# Patient Record
Sex: Female | Born: 1955 | Race: Black or African American | Hispanic: No | Smoking: Never smoker
Health system: Southern US, Community
[De-identification: ages and names within clinical notes are randomized; demographics above are authoritative.]

## PROBLEM LIST (undated history)

## (undated) DIAGNOSIS — I10 Essential (primary) hypertension: Secondary | ICD-10-CM

## (undated) DIAGNOSIS — M1990 Unspecified osteoarthritis, unspecified site: Secondary | ICD-10-CM

## (undated) DIAGNOSIS — E7800 Pure hypercholesterolemia, unspecified: Secondary | ICD-10-CM

## (undated) DIAGNOSIS — K219 Gastro-esophageal reflux disease without esophagitis: Secondary | ICD-10-CM

## (undated) DIAGNOSIS — G629 Polyneuropathy, unspecified: Secondary | ICD-10-CM

## (undated) DIAGNOSIS — R52 Pain, unspecified: Secondary | ICD-10-CM

## (undated) DIAGNOSIS — G4730 Sleep apnea, unspecified: Secondary | ICD-10-CM

## (undated) HISTORY — DX: Sleep apnea, unspecified: G47.30

## (undated) HISTORY — DX: Polyneuropathy, unspecified: G62.9

## (undated) HISTORY — DX: Unspecified osteoarthritis, unspecified site: M19.90

## (undated) HISTORY — PX: TUBAL LIGATION: SHX77

## (undated) HISTORY — PX: ABDOMINAL HYSTERECTOMY: SHX81

## (undated) HISTORY — DX: Gastro-esophageal reflux disease without esophagitis: K21.9

---

## 2000-09-26 ENCOUNTER — Emergency Department (HOSPITAL_COMMUNITY): Admission: EM | Admit: 2000-09-26 | Discharge: 2000-09-27 | Payer: Self-pay | Admitting: Internal Medicine

## 2000-12-15 ENCOUNTER — Emergency Department (HOSPITAL_COMMUNITY): Admission: EM | Admit: 2000-12-15 | Discharge: 2000-12-15 | Payer: Self-pay | Admitting: Emergency Medicine

## 2002-06-13 ENCOUNTER — Encounter: Payer: Self-pay | Admitting: Family Medicine

## 2002-06-13 ENCOUNTER — Ambulatory Visit (HOSPITAL_COMMUNITY): Admission: RE | Admit: 2002-06-13 | Discharge: 2002-06-13 | Payer: Self-pay | Admitting: Family Medicine

## 2002-07-04 ENCOUNTER — Emergency Department (HOSPITAL_COMMUNITY): Admission: EM | Admit: 2002-07-04 | Discharge: 2002-07-04 | Payer: Self-pay | Admitting: Emergency Medicine

## 2003-01-20 ENCOUNTER — Emergency Department (HOSPITAL_COMMUNITY): Admission: EM | Admit: 2003-01-20 | Discharge: 2003-01-20 | Payer: Self-pay | Admitting: *Deleted

## 2003-05-15 ENCOUNTER — Emergency Department (HOSPITAL_COMMUNITY): Admission: EM | Admit: 2003-05-15 | Discharge: 2003-05-16 | Payer: Self-pay | Admitting: *Deleted

## 2003-05-16 ENCOUNTER — Ambulatory Visit (HOSPITAL_COMMUNITY): Admission: RE | Admit: 2003-05-16 | Discharge: 2003-05-16 | Payer: Self-pay | Admitting: *Deleted

## 2003-05-21 ENCOUNTER — Ambulatory Visit (HOSPITAL_COMMUNITY): Admission: RE | Admit: 2003-05-21 | Discharge: 2003-05-21 | Payer: Self-pay | Admitting: Family Medicine

## 2003-11-28 ENCOUNTER — Ambulatory Visit (HOSPITAL_COMMUNITY): Admission: RE | Admit: 2003-11-28 | Discharge: 2003-11-28 | Payer: Self-pay | Admitting: Family Medicine

## 2004-05-22 ENCOUNTER — Emergency Department (HOSPITAL_COMMUNITY): Admission: EM | Admit: 2004-05-22 | Discharge: 2004-05-22 | Payer: Self-pay | Admitting: Emergency Medicine

## 2005-01-14 ENCOUNTER — Ambulatory Visit (HOSPITAL_COMMUNITY): Admission: RE | Admit: 2005-01-14 | Discharge: 2005-01-14 | Payer: Self-pay | Admitting: Family Medicine

## 2005-11-13 ENCOUNTER — Emergency Department (HOSPITAL_COMMUNITY): Admission: EM | Admit: 2005-11-13 | Discharge: 2005-11-13 | Payer: Self-pay | Admitting: Emergency Medicine

## 2006-02-07 ENCOUNTER — Ambulatory Visit (HOSPITAL_COMMUNITY): Admission: RE | Admit: 2006-02-07 | Discharge: 2006-02-07 | Payer: Self-pay | Admitting: Family Medicine

## 2006-04-15 ENCOUNTER — Emergency Department (HOSPITAL_COMMUNITY): Admission: EM | Admit: 2006-04-15 | Discharge: 2006-04-15 | Payer: Self-pay | Admitting: Emergency Medicine

## 2006-05-04 ENCOUNTER — Emergency Department (HOSPITAL_COMMUNITY): Admission: EM | Admit: 2006-05-04 | Discharge: 2006-05-04 | Payer: Self-pay | Admitting: Emergency Medicine

## 2007-06-06 ENCOUNTER — Ambulatory Visit (HOSPITAL_COMMUNITY): Admission: RE | Admit: 2007-06-06 | Discharge: 2007-06-06 | Payer: Self-pay | Admitting: Family Medicine

## 2008-06-16 ENCOUNTER — Ambulatory Visit (HOSPITAL_COMMUNITY): Admission: RE | Admit: 2008-06-16 | Discharge: 2008-06-16 | Payer: Self-pay | Admitting: Obstetrics and Gynecology

## 2008-11-25 ENCOUNTER — Encounter (INDEPENDENT_AMBULATORY_CARE_PROVIDER_SITE_OTHER): Payer: Self-pay | Admitting: *Deleted

## 2009-07-21 ENCOUNTER — Ambulatory Visit (HOSPITAL_COMMUNITY): Admission: RE | Admit: 2009-07-21 | Discharge: 2009-07-21 | Payer: Self-pay | Admitting: Family Medicine

## 2009-07-31 ENCOUNTER — Encounter: Payer: Self-pay | Admitting: Gastroenterology

## 2009-08-04 ENCOUNTER — Telehealth (INDEPENDENT_AMBULATORY_CARE_PROVIDER_SITE_OTHER): Payer: Self-pay

## 2009-08-11 ENCOUNTER — Ambulatory Visit: Payer: Self-pay | Admitting: Gastroenterology

## 2009-08-11 ENCOUNTER — Ambulatory Visit (HOSPITAL_COMMUNITY): Admission: RE | Admit: 2009-08-11 | Discharge: 2009-08-11 | Payer: Self-pay | Admitting: Gastroenterology

## 2009-10-17 ENCOUNTER — Emergency Department (HOSPITAL_COMMUNITY): Admission: EM | Admit: 2009-10-17 | Discharge: 2009-10-17 | Payer: Self-pay | Admitting: Emergency Medicine

## 2010-01-31 ENCOUNTER — Encounter: Payer: Self-pay | Admitting: Family Medicine

## 2010-02-11 NOTE — Progress Notes (Signed)
Summary: phone note/pt rescheduled lmissed TCS  Phone Note Call from Patient   Caller: Patient Summary of Call: Pt called, could not make appt yesterday, ride issues. Has rescheduled to 08/11/2009 @ 8:15. Kim aware. Initial call taken by: Cloria Spring LPN,  August 04, 2009 4:20 PM

## 2010-02-11 NOTE — Letter (Signed)
Summary: TCS TRIAGE  TCS TRIAGE   Imported By: Diana Eves 07/31/2009 16:55:06  _____________________________________________________________________  External Attachment:    Type:   Image     Comment:   External Document

## 2010-03-24 LAB — RAPID STREP SCREEN (MED CTR MEBANE ONLY): Streptococcus, Group A Screen (Direct): NEGATIVE

## 2010-07-01 ENCOUNTER — Other Ambulatory Visit (HOSPITAL_COMMUNITY): Payer: Self-pay | Admitting: Family Medicine

## 2010-07-01 DIAGNOSIS — Z139 Encounter for screening, unspecified: Secondary | ICD-10-CM

## 2010-07-26 ENCOUNTER — Ambulatory Visit (HOSPITAL_COMMUNITY): Payer: Medicare Other

## 2010-07-27 ENCOUNTER — Ambulatory Visit (HOSPITAL_COMMUNITY): Payer: Medicare Other

## 2010-08-02 ENCOUNTER — Ambulatory Visit (HOSPITAL_COMMUNITY): Payer: Medicare Other

## 2010-08-06 ENCOUNTER — Ambulatory Visit (HOSPITAL_COMMUNITY)
Admission: RE | Admit: 2010-08-06 | Discharge: 2010-08-06 | Disposition: A | Discharge status: Home / Self Care | Payer: Medicare Other | Source: Ambulatory Visit | Attending: Family Medicine | Admitting: Family Medicine

## 2010-08-06 DIAGNOSIS — Z1231 Encounter for screening mammogram for malignant neoplasm of breast: Secondary | ICD-10-CM | POA: Insufficient documentation

## 2010-08-06 DIAGNOSIS — Z139 Encounter for screening, unspecified: Secondary | ICD-10-CM

## 2010-08-09 ENCOUNTER — Ambulatory Visit (HOSPITAL_COMMUNITY): Payer: Medicare Other

## 2011-08-22 ENCOUNTER — Other Ambulatory Visit (HOSPITAL_COMMUNITY): Payer: Self-pay | Admitting: Family Medicine

## 2011-08-22 DIAGNOSIS — Z139 Encounter for screening, unspecified: Secondary | ICD-10-CM

## 2011-08-23 ENCOUNTER — Ambulatory Visit (HOSPITAL_COMMUNITY)
Admission: RE | Admit: 2011-08-23 | Discharge: 2011-08-23 | Disposition: A | Discharge status: Home / Self Care | Payer: Medicare Other | Source: Ambulatory Visit | Attending: Family Medicine | Admitting: Family Medicine

## 2011-08-23 DIAGNOSIS — Z139 Encounter for screening, unspecified: Secondary | ICD-10-CM

## 2011-08-23 DIAGNOSIS — Z1231 Encounter for screening mammogram for malignant neoplasm of breast: Secondary | ICD-10-CM | POA: Insufficient documentation

## 2011-09-13 ENCOUNTER — Ambulatory Visit: Payer: Medicare Other | Attending: Neurology | Admitting: Sleep Medicine

## 2011-09-13 DIAGNOSIS — G4700 Insomnia, unspecified: Secondary | ICD-10-CM

## 2011-09-13 DIAGNOSIS — G4733 Obstructive sleep apnea (adult) (pediatric): Secondary | ICD-10-CM | POA: Insufficient documentation

## 2011-09-13 DIAGNOSIS — Z6841 Body Mass Index (BMI) 40.0 and over, adult: Secondary | ICD-10-CM | POA: Insufficient documentation

## 2011-09-22 NOTE — Procedures (Signed)
[facility] [name] A. Gerilyn Pilgrim, MD     [email or web address]         NAME:  Breanna [name], Breanna [name]                   ACCOUNT NO.:  [id number]  MEDICAL RECORD NO.:  [id number]          PATIENT TYPE:  OUT  LOCATION:  SLEEP LAB                     FACILITY:  [facility]  PHYSICIAN:  Kaisley Stiverson A. Gerilyn Pilgrim, M.D. DATE OF BIRTH:  1955/10/02  DATE OF STUDY:  09/13/2011                           NOCTURNAL POLYSOMNOGRAM  REFERRING PHYSICIAN:  Mila Homer. Sudie Bailey, M.D.  REFERRING PHYSICIAN:  Saloni Lablanc A. Gerilyn Pilgrim, M.D.  INDICATION:  This is a 56 year old lady who presents with obesity, fatigue, and snoring.  The study is being done to evaluate for obstructive sleep apnea syndrome.  MEDICATIONS:  Alprazolam, Ambien.  EPWORTH SLEEPINESS SCALE: 1.   BMI 54.  ARCHITECTURAL SUMMARY:  The total recording time is 366 minutes.  Sleep efficiency 57%.  Sleep latency 30 minutes.  REM latency 0 minutes. Stage N1 12.5%, N2 87.5%, N3 0%, and REM sleep 0%.  RESPIRATORY SUMMARY:  Baseline oxygen saturation is 99%, lowest saturation 81%.  Diagnostic AHI 27 and RDI 28.  LIMB MOVEMENT SUMMARY:  PLM index 0.  ELECTROCARDIOGRAM SUMMARY:  Average heart rate is 71 with no significant dysrhythmias observed.  IMPRESSION: 1. Moderate obstructive sleep apnea syndrome. Suggest formal titration.  2. Abnormal sleep architecture with absent slow-wave sleep and REM     sleep.    Taleisha Kaczynski A. Gerilyn Pilgrim, M.D.    KAD/MEDQ  D:  09/22/2011 09:41:11  T:  09/22/2011 09:57:24  Job:  [id number]

## 2012-05-23 ENCOUNTER — Other Ambulatory Visit: Payer: Self-pay | Admitting: Neurology

## 2012-05-23 DIAGNOSIS — G4730 Sleep apnea, unspecified: Secondary | ICD-10-CM

## 2012-05-31 ENCOUNTER — Ambulatory Visit: Payer: Medicare Other | Attending: Neurology | Admitting: Sleep Medicine

## 2012-05-31 DIAGNOSIS — G4733 Obstructive sleep apnea (adult) (pediatric): Secondary | ICD-10-CM | POA: Insufficient documentation

## 2012-05-31 DIAGNOSIS — G4730 Sleep apnea, unspecified: Secondary | ICD-10-CM

## 2012-05-31 DIAGNOSIS — G4700 Insomnia, unspecified: Secondary | ICD-10-CM

## 2012-06-07 NOTE — Procedures (Signed)
[facility] Payslee Bateson A. Gerilyn Pilgrim, MD     [email or web address]        NAME:  Breanna [name], Breanna [name]                   ACCOUNT NO.:  [id number]  MEDICAL RECORD NO.:  [id number]          PATIENT TYPE:  OUT  LOCATION:  SLEEP LAB                     FACILITY:  [facility]  PHYSICIAN:  Kelsy Polack A. Gerilyn Pilgrim, M.D. DATE OF BIRTH:  02-01-1955  DATE OF STUDY:  05/31/2012                           NOCTURNAL POLYSOMNOGRAM  REFERRING PHYSICIAN:  Belisa Eichholz A. Gerilyn Pilgrim, M.D.  INDICATION:  This is a 57 year old lady who has a history of obstructive sleep apnea syndrome diagnosed by polysomnography.  This is a CPAP titration recording.  MEDICATIONS:  Alprazolam, Flonase, Ambien, Cymbalta, Phenergan, lovastatin, potassium.  EPWORTH SLEEPINESS SCORE:  1. BMI 51.  ARCHITECTURAL SUMMARY:  The total recording time is 370 minutes.  Sleep efficiency 35%.  Sleep latency 43 minutes.  REM latency 0, stage N1 is 23%, N2 is 76% N3 is 0.4%, and REM sleep 0%.  RESPIRATORY SUMMARY:  Baseline oxygen saturation 93, lowest saturation 85.  The patient was placed on positive pressure is 5 and titrated to 10.  Optimal pressure is 10 with resolution of obstructive events.  The patient was noted to have episodes of periodic breathing with sleep onset precipitating central apneas.  These occurred throughout the recording even on the patient optimal pressure.  LIMB MOVEMENT SUMMARY:  PLM index 0.  ELECTROCARDIOGRAM SUMMARY:  Average heart rate is 70 with no significant dysrhythmias observed.  IMPRESSION: 1. Obstructive sleep apnea syndrome, which responds well to a CPAP of     9. 2. Periodic breathing.  RECOMMENDATION:  CPAP of 9 and use of hypnotic sedative medication to treat the patient's periodic breathing.     Braxtyn Bojarski A. Gerilyn Pilgrim, M.D.    KAD/MEDQ  D:  06/07/2012 09:48:34  T:  06/07/2012 10:01:55  Job:  [id number]

## 2012-06-25 DIAGNOSIS — R109 Unspecified abdominal pain: Secondary | ICD-10-CM

## 2012-08-16 ENCOUNTER — Other Ambulatory Visit (HOSPITAL_COMMUNITY): Payer: Self-pay | Admitting: Family Medicine

## 2012-08-16 DIAGNOSIS — Z09 Encounter for follow-up examination after completed treatment for conditions other than malignant neoplasm: Secondary | ICD-10-CM

## 2012-08-24 ENCOUNTER — Ambulatory Visit (HOSPITAL_COMMUNITY)
Admission: RE | Admit: 2012-08-24 | Discharge: 2012-08-24 | Disposition: A | Discharge status: Home / Self Care | Payer: Medicare Other | Source: Ambulatory Visit | Attending: Family Medicine | Admitting: Family Medicine

## 2012-08-24 DIAGNOSIS — Z09 Encounter for follow-up examination after completed treatment for conditions other than malignant neoplasm: Secondary | ICD-10-CM

## 2012-08-24 DIAGNOSIS — Z1231 Encounter for screening mammogram for malignant neoplasm of breast: Secondary | ICD-10-CM | POA: Insufficient documentation

## 2013-06-27 ENCOUNTER — Emergency Department (HOSPITAL_COMMUNITY)
Admission: EM | Admit: 2013-06-27 | Discharge: 2013-06-27 | Disposition: A | Discharge status: Home / Self Care | Payer: Medicare Other | Attending: Emergency Medicine | Admitting: Emergency Medicine

## 2013-06-27 ENCOUNTER — Encounter (HOSPITAL_COMMUNITY): Payer: Self-pay | Admitting: Emergency Medicine

## 2013-06-27 DIAGNOSIS — Z862 Personal history of diseases of the blood and blood-forming organs and certain disorders involving the immune mechanism: Secondary | ICD-10-CM | POA: Diagnosis not present

## 2013-06-27 DIAGNOSIS — Z8639 Personal history of other endocrine, nutritional and metabolic disease: Secondary | ICD-10-CM | POA: Insufficient documentation

## 2013-06-27 DIAGNOSIS — Z760 Encounter for issue of repeat prescription: Secondary | ICD-10-CM | POA: Insufficient documentation

## 2013-06-27 DIAGNOSIS — I10 Essential (primary) hypertension: Secondary | ICD-10-CM | POA: Diagnosis not present

## 2013-06-27 HISTORY — DX: Pain, unspecified: R52

## 2013-06-27 HISTORY — DX: Pure hypercholesterolemia, unspecified: E78.00

## 2013-06-27 HISTORY — DX: Essential (primary) hypertension: I10

## 2013-06-27 MED ORDER — PROMETHAZINE HCL 25 MG PO TABS: 25.0000 mg | ORAL_TABLET | Freq: Four times a day (QID) | ORAL | Status: AC | PRN

## 2013-06-27 NOTE — ED Provider Notes (Signed)
  Medical screening examination/treatment/procedure(s) were performed by non-physician practitioner and as supervising physician I was immediately available for consultation/collaboration.   EKG Interpretation None            Robert Lockwood, MD 06/27/13 1404 

## 2013-06-27 NOTE — Discharge Instructions (Signed)
Medication Refill, Emergency Department °We have refilled your medication today as a courtesy to you. It is best for your medical care, however, to take care of getting refills done through your primary caregiver's office. They have your records and can do a better job of follow-up than we can in the emergency department. °On maintenance medications, we often only prescribe enough medications to get you by until you are able to see your regular caregiver. This is a more expensive way to refill medications. °In the future, please plan for refills so that you will not have to use the emergency department for this. °Thank you for your help. Your help allows us to better take care of the daily emergencies that enter our department. °Document Released: 04/15/2003 Document Revised: 03/21/2011 Document Reviewed: 12/27/2004 °ExitCare® Patient Information ©2015 [employer]. This information is not intended to replace advice given to you by your health care provider. Make sure you discuss any questions you have with your health care provider. ° °

## 2013-06-27 NOTE — ED Notes (Signed)
Pt reports is out of her phenergan.  Reports Dr. Sudie BaileyKnowlton prescribes it to take before taking her pain medication.  Pt says she did not have any refills on her prescription and his office is closed today.

## 2013-06-27 NOTE — ED Provider Notes (Signed)
CSN: [id number]     Arrival date & time 06/27/13  1314 History   First MD Initiated Contact with Patient 06/27/13 1334     Chief Complaint  Patient presents with  . Medication Refill     (Consider location/radiation/quality/duration/timing/severity/associated sxs/prior Treatment) The history is provided by the patient.   Breanna [name] is a 58 y.o. female who presents to the ED for medication refill. She needs her phenergan to take before taking her pain medication. She ran out and Dr. Sudie BaileyKnowlton is out of the office today. She request Phenergan until she can see him in the office. Patient denies any problems today she just doesn't want to get nausea and vomiting when she takes her other medications.  Past Medical History  Diagnosis Date  . Hypertension   . Hypercholesterolemia   . Pain    Past Surgical History  Procedure Laterality Date  . Abdominal hysterectomy    . Tubal ligation     No family history on file. History  Substance Use Topics  . Smoking status: Never Smoker   . Smokeless tobacco: Not on file  . Alcohol Use: No       OB History   Grav Para Term Preterm Abortions TAB SAB Ect Mult Living                 Review of Systems Negative except as stated in HPI   Allergies  Review of patient's allergies indicates no known allergies.  Home Medications   Prior to Admission medications   Medication Sig Start Date End Date Taking? Authorizing Provider  promethazine (PHENERGAN) 25 MG tablet Take 1 tablet (25 mg total) by mouth every 6 (six) hours as needed for nausea or vomiting. 06/27/13   Hope Orlene OchM Neese, NP   BP 114/73  Pulse 87  Temp(Src) 98.1 F (36.7 C) (Oral)  Resp 18  Ht 5\' 2"  (1.575 m)  Wt 272 lb (123.378 kg)  BMI 49.74 kg/m2  SpO2 94% Physical Exam  Nursing note and vitals reviewed. Constitutional: She is oriented to person, place, and time. She appears well-developed and well-nourished. No distress.  HENT:  Head: Normocephalic.  Eyes: EOM are  normal.  Neck: Neck supple.  Cardiovascular: Normal rate.   Pulmonary/Chest: Effort normal.  Musculoskeletal:  Walks with a cane.   Neurological: She is alert and oriented to person, place, and time. No cranial nerve deficit.  Skin: Skin is warm and dry.  Psychiatric: She has a normal mood and affect. Her behavior is normal.    ED Course  Procedures  MDM  58 y.o. female here for medication refill. Discussed need for planning for refills in advance. Stable for discharge without any complaints today.    Medication List         promethazine 25 MG tablet  Commonly known as:  PHENERGAN  Take 1 tablet (25 mg total) by mouth every 6 (six) hours as needed for nausea or vomiting.           [facility] Orlene OchM Neese, [place] 06/27/13 1341

## 2013-09-11 ENCOUNTER — Other Ambulatory Visit (HOSPITAL_COMMUNITY): Payer: Self-pay | Admitting: Family Medicine

## 2013-09-11 DIAGNOSIS — Z1231 Encounter for screening mammogram for malignant neoplasm of breast: Secondary | ICD-10-CM

## 2013-09-20 ENCOUNTER — Ambulatory Visit (HOSPITAL_COMMUNITY)
Admission: RE | Admit: 2013-09-20 | Discharge: 2013-09-20 | Disposition: A | Discharge status: Home / Self Care | Payer: Medicare Other | Source: Ambulatory Visit | Attending: Family Medicine | Admitting: Family Medicine

## 2013-09-20 DIAGNOSIS — Z1231 Encounter for screening mammogram for malignant neoplasm of breast: Secondary | ICD-10-CM

## 2014-11-05 ENCOUNTER — Other Ambulatory Visit (HOSPITAL_COMMUNITY): Payer: Self-pay | Admitting: Family Medicine

## 2014-11-05 DIAGNOSIS — Z1231 Encounter for screening mammogram for malignant neoplasm of breast: Secondary | ICD-10-CM

## 2014-11-20 ENCOUNTER — Ambulatory Visit (HOSPITAL_COMMUNITY)
Admission: RE | Admit: 2014-11-20 | Discharge: 2014-11-20 | Disposition: A | Discharge status: Home / Self Care | Payer: Medicare Other | Source: Ambulatory Visit | Attending: Family Medicine | Admitting: Family Medicine

## 2014-11-20 DIAGNOSIS — Z1231 Encounter for screening mammogram for malignant neoplasm of breast: Secondary | ICD-10-CM | POA: Insufficient documentation

## 2015-09-28 ENCOUNTER — Other Ambulatory Visit (HOSPITAL_COMMUNITY): Payer: Self-pay | Admitting: Family Medicine

## 2015-09-28 ENCOUNTER — Ambulatory Visit (HOSPITAL_COMMUNITY)
Admission: RE | Admit: 2015-09-28 | Discharge: 2015-09-28 | Disposition: A | Discharge status: Home / Self Care | Payer: Medicare Other | Source: Ambulatory Visit | Attending: Family Medicine | Admitting: Family Medicine

## 2015-09-28 DIAGNOSIS — M19012 Primary osteoarthritis, left shoulder: Secondary | ICD-10-CM | POA: Insufficient documentation

## 2015-09-28 DIAGNOSIS — M25512 Pain in left shoulder: Secondary | ICD-10-CM | POA: Diagnosis present

## 2015-09-28 DIAGNOSIS — R52 Pain, unspecified: Secondary | ICD-10-CM

## 2015-10-16 ENCOUNTER — Other Ambulatory Visit (HOSPITAL_COMMUNITY): Payer: Self-pay | Admitting: Family Medicine

## 2015-10-16 DIAGNOSIS — Z1231 Encounter for screening mammogram for malignant neoplasm of breast: Secondary | ICD-10-CM

## 2015-11-23 ENCOUNTER — Ambulatory Visit (HOSPITAL_COMMUNITY)
Admission: RE | Admit: 2015-11-23 | Discharge: 2015-11-23 | Disposition: A | Discharge status: Home / Self Care | Payer: Medicare Other | Source: Ambulatory Visit | Attending: Family Medicine | Admitting: Family Medicine

## 2015-11-23 DIAGNOSIS — Z1231 Encounter for screening mammogram for malignant neoplasm of breast: Secondary | ICD-10-CM | POA: Diagnosis not present

## 2016-10-25 ENCOUNTER — Other Ambulatory Visit (HOSPITAL_COMMUNITY): Payer: Self-pay | Admitting: Family Medicine

## 2016-10-25 DIAGNOSIS — Z1231 Encounter for screening mammogram for malignant neoplasm of breast: Secondary | ICD-10-CM

## 2016-11-23 ENCOUNTER — Ambulatory Visit (HOSPITAL_COMMUNITY): Payer: Medicare Other

## 2016-11-28 ENCOUNTER — Ambulatory Visit (HOSPITAL_COMMUNITY)
Admission: RE | Admit: 2016-11-28 | Discharge: 2016-11-28 | Disposition: A | Discharge status: Home / Self Care | Payer: Medicare Other | Source: Ambulatory Visit | Attending: Family Medicine | Admitting: Family Medicine

## 2016-11-28 ENCOUNTER — Encounter (HOSPITAL_COMMUNITY): Payer: Self-pay

## 2016-11-28 DIAGNOSIS — Z1231 Encounter for screening mammogram for malignant neoplasm of breast: Secondary | ICD-10-CM | POA: Insufficient documentation

## 2017-05-25 ENCOUNTER — Ambulatory Visit (HOSPITAL_COMMUNITY)
Admission: RE | Admit: 2017-05-25 | Discharge: 2017-05-25 | Disposition: A | Discharge status: Home / Self Care | Payer: Medicare Other | Source: Ambulatory Visit | Attending: Nurse Practitioner | Admitting: Nurse Practitioner

## 2017-05-25 ENCOUNTER — Other Ambulatory Visit (HOSPITAL_COMMUNITY): Payer: Self-pay | Admitting: Nurse Practitioner

## 2017-05-25 DIAGNOSIS — M25561 Pain in right knee: Secondary | ICD-10-CM

## 2017-05-25 DIAGNOSIS — M8588 Other specified disorders of bone density and structure, other site: Secondary | ICD-10-CM | POA: Diagnosis not present

## 2017-05-25 DIAGNOSIS — M25562 Pain in left knee: Secondary | ICD-10-CM | POA: Insufficient documentation

## 2017-06-20 ENCOUNTER — Other Ambulatory Visit (HOSPITAL_COMMUNITY): Payer: Self-pay | Admitting: Family Medicine

## 2017-06-20 DIAGNOSIS — M545 Low back pain: Secondary | ICD-10-CM

## 2017-06-23 ENCOUNTER — Ambulatory Visit (HOSPITAL_COMMUNITY): Payer: Medicare Other

## 2017-06-23 ENCOUNTER — Ambulatory Visit (HOSPITAL_COMMUNITY)
Admission: RE | Admit: 2017-06-23 | Discharge: 2017-06-23 | Disposition: A | Discharge status: Home / Self Care | Payer: Medicare Other | Source: Ambulatory Visit | Attending: Family Medicine | Admitting: Family Medicine

## 2017-06-23 DIAGNOSIS — M545 Low back pain: Secondary | ICD-10-CM

## 2017-12-20 ENCOUNTER — Other Ambulatory Visit (HOSPITAL_COMMUNITY): Payer: Self-pay | Admitting: Family Medicine

## 2017-12-20 DIAGNOSIS — Z1231 Encounter for screening mammogram for malignant neoplasm of breast: Secondary | ICD-10-CM

## 2017-12-21 ENCOUNTER — Inpatient Hospital Stay (HOSPITAL_COMMUNITY): Admission: RE | Admit: 2017-12-21 | Payer: Medicare Other | Source: Ambulatory Visit

## 2018-01-11 ENCOUNTER — Ambulatory Visit (HOSPITAL_COMMUNITY): Payer: Medicare Other

## 2018-02-01 ENCOUNTER — Ambulatory Visit (HOSPITAL_COMMUNITY)
Admission: RE | Admit: 2018-02-01 | Discharge: 2018-02-01 | Disposition: A | Discharge status: Home / Self Care | Payer: Medicare Other | Source: Ambulatory Visit | Attending: Family Medicine | Admitting: Family Medicine

## 2018-02-01 DIAGNOSIS — Z1231 Encounter for screening mammogram for malignant neoplasm of breast: Secondary | ICD-10-CM | POA: Insufficient documentation

## 2018-08-10 ENCOUNTER — Other Ambulatory Visit: Payer: Self-pay

## 2018-08-10 ENCOUNTER — Other Ambulatory Visit: Payer: Medicare Other

## 2018-08-10 DIAGNOSIS — Z20822 Contact with and (suspected) exposure to covid-19: Secondary | ICD-10-CM

## 2018-08-12 LAB — NOVEL CORONAVIRUS, NAA: SARS-CoV-2, NAA: NOT DETECTED

## 2019-02-10 ENCOUNTER — Ambulatory Visit: Payer: Medicare Other

## 2019-02-15 ENCOUNTER — Ambulatory Visit: Payer: Medicare Other

## 2019-02-21 ENCOUNTER — Ambulatory Visit: Payer: Medicare Other

## 2019-03-11 ENCOUNTER — Other Ambulatory Visit (HOSPITAL_COMMUNITY): Payer: Self-pay | Admitting: Family Medicine

## 2019-03-11 DIAGNOSIS — Z1231 Encounter for screening mammogram for malignant neoplasm of breast: Secondary | ICD-10-CM

## 2019-03-13 ENCOUNTER — Ambulatory Visit (HOSPITAL_COMMUNITY)
Admission: RE | Admit: 2019-03-13 | Discharge: 2019-03-13 | Disposition: A | Discharge status: Home / Self Care | Payer: Medicare Other | Source: Ambulatory Visit | Attending: Family Medicine | Admitting: Family Medicine

## 2019-03-13 ENCOUNTER — Other Ambulatory Visit: Payer: Self-pay

## 2019-03-13 DIAGNOSIS — Z1231 Encounter for screening mammogram for malignant neoplasm of breast: Secondary | ICD-10-CM | POA: Diagnosis present

## 2019-03-14 ENCOUNTER — Ambulatory Visit (HOSPITAL_COMMUNITY): Payer: Medicare Other

## 2019-03-15 ENCOUNTER — Other Ambulatory Visit (HOSPITAL_COMMUNITY): Payer: Self-pay | Admitting: Family Medicine

## 2019-03-15 DIAGNOSIS — R928 Other abnormal and inconclusive findings on diagnostic imaging of breast: Secondary | ICD-10-CM

## 2019-03-19 ENCOUNTER — Ambulatory Visit (HOSPITAL_COMMUNITY)
Admission: RE | Admit: 2019-03-19 | Discharge: 2019-03-19 | Disposition: A | Discharge status: Home / Self Care | Payer: Medicare Other | Source: Ambulatory Visit | Attending: Family Medicine | Admitting: Family Medicine

## 2019-03-19 ENCOUNTER — Other Ambulatory Visit: Payer: Self-pay

## 2019-03-19 ENCOUNTER — Other Ambulatory Visit (HOSPITAL_COMMUNITY): Payer: Self-pay | Admitting: Family Medicine

## 2019-03-19 DIAGNOSIS — R928 Other abnormal and inconclusive findings on diagnostic imaging of breast: Secondary | ICD-10-CM

## 2019-03-26 ENCOUNTER — Other Ambulatory Visit (HOSPITAL_COMMUNITY): Payer: Self-pay | Admitting: Family Medicine

## 2019-03-26 ENCOUNTER — Ambulatory Visit (HOSPITAL_COMMUNITY)
Admission: RE | Admit: 2019-03-26 | Discharge: 2019-03-26 | Disposition: A | Discharge status: Home / Self Care | Payer: Medicare Other | Source: Ambulatory Visit | Attending: Family Medicine | Admitting: Family Medicine

## 2019-03-26 ENCOUNTER — Other Ambulatory Visit: Payer: Self-pay

## 2019-03-26 DIAGNOSIS — R928 Other abnormal and inconclusive findings on diagnostic imaging of breast: Secondary | ICD-10-CM

## 2019-03-26 MED ORDER — LIDOCAINE-EPINEPHRINE (PF) 1 %-1:200000 IJ SOLN
INTRAMUSCULAR | Status: AC
  Filled 2019-03-26: qty 30

## 2019-03-26 MED ORDER — SODIUM BICARBONATE 4.2 % IV SOLN
INTRAVENOUS | Status: AC
  Filled 2019-03-26: qty 10

## 2019-03-26 MED ORDER — LIDOCAINE HCL (PF) 2 % IJ SOLN
INTRAMUSCULAR | Status: AC
  Filled 2019-03-26: qty 10

## 2019-03-28 LAB — SURGICAL PATHOLOGY

## 2019-06-17 ENCOUNTER — Encounter: Payer: Self-pay | Admitting: Gastroenterology

## 2020-04-14 ENCOUNTER — Encounter: Payer: Medicare Other | Attending: Family Medicine | Admitting: Nutrition

## 2020-04-14 ENCOUNTER — Other Ambulatory Visit: Payer: Self-pay

## 2020-04-14 VITALS — Ht 62.0 in | Wt 314.0 lb

## 2020-04-14 DIAGNOSIS — Z6841 Body Mass Index (BMI) 40.0 and over, adult: Secondary | ICD-10-CM | POA: Insufficient documentation

## 2020-04-14 NOTE — Progress Notes (Signed)
Medical Nutrition Therapy  Appointment Start time:  1530  Appointment End time:   1630  Primary concerns today: Morbid obesity Referral diagnosis: e66.01 Preferred learning style:  no preference indicated Learning readiness:  change in progress   NUTRITION ASSESSMENT   Anthropometrics  Wt Readings from Last 3 Encounters:  04/14/20 (!) 314 lb (142.4 kg)  06/27/13 272 lb (123.4 kg)   Ht Readings from Last 3 Encounters:  04/14/20 5\' 2"  (1.575 m)  06/27/13 5\' 2"  (1.575 m)   Body mass index is 57.43 kg/m. @BMIFA @ Facility age limit for growth percentiles is 20 years. Facility age limit for growth percentiles is 20 years.   Clinical Medical Hx: HTN, Morbid obesity Medications: see chart Labs:  Notable Signs/Symptoms: Fatigue, no energy, joint pain.  Lifestyle & Dietary Hx Retired. Not on any schedule. Sleeps in and then stays up later at night. Skips meals. Snacks instead of eating meals. Doesn't cook a lot. Drinking a lot of liquid calories and eating high salt high fat foods.   Estimated daily fluid intake: 24 oz Supplements:  Sleep: 6-8  Stress / self-care: Current average weekly physical activity: adl  24-Hr Dietary Recall First Meal: skipped Snack: Second Meal: Snack: Third Meal: 4 pm: roast, rice chicken flavored, soda or tea  9 pm roast sandwich, Soda, or sweet tea Snack: chips or popcorn or candy bar Beverages: soda, tea,   Estimated Energy Needs Calories: 1200 Carbohydrate: 135g Protein: 90g Fat: 30g   NUTRITION DIAGNOSIS  NB-1.1 Food and nutrition-related knowledge deficit As related to Obesity.  As evidenced by BMI > 40.   NUTRITION INTERVENTION  Nutrition education (E-1) on the following topics:  . Nutrition and weight loss education provided on My Plate, CHO counting, meal planning, portion sizes, timing of meals, avoiding snacks between meals. Importance of nutrient density calories  Vs empty calories. Meal planning and portion control. Need  for exercising 30 minutes per day and prevention of DM. 06/29/13 Weight loss tips, food journaling and accountability. Emotional eating. . Dangers of skipping meals.   Handouts Provided Include   MyPlate  Weight loss tips   Learning Style & Readiness for Change Teaching method utilized: Visual & Auditory  Demonstrated degree of understanding via: Teach Back  Barriers to learning/adherence to lifestyle change: none  Goals Established by Pt Goals  Follow MY Plate Eat three meals per day Do not skip meals. Increase fresh fruits and vegetables Cut out sodas, tea and concentrated sweets Do chair exercise 15- 30 minutes a day Keep a food journal Lose 1-2 lbs per week.  MONITORING & EVALUATION Dietary intake, weekly physical activity, and weight in 1 month.  Next Steps  Patient is to work on emotionally preparing to make lifestyle changes. 

## 2020-04-30 ENCOUNTER — Encounter: Payer: Self-pay | Admitting: Nutrition

## 2020-04-30 NOTE — Patient Instructions (Addendum)
Goals  Follow MY Plate Eat three meals per day Do not skip meals. Increase fresh fruits and vegetables Cut out sodas, tea and concentrated sweets Do chair exercises 15-30 minutes a day Lose 1-2 lbs per week.

## 2020-05-14 ENCOUNTER — Ambulatory Visit: Payer: Medicare Other | Admitting: Nutrition

## 2020-05-19 ENCOUNTER — Ambulatory Visit: Payer: Medicare Other | Admitting: Nutrition

## 2021-05-11 ENCOUNTER — Other Ambulatory Visit: Payer: Self-pay | Admitting: Family Medicine

## 2021-05-11 DIAGNOSIS — M545 Low back pain, unspecified: Secondary | ICD-10-CM

## 2021-05-24 ENCOUNTER — Ambulatory Visit
Admission: RE | Admit: 2021-05-24 | Discharge: 2021-05-24 | Disposition: A | Discharge status: Home / Self Care | Payer: Medicare Other | Source: Ambulatory Visit | Attending: Family Medicine | Admitting: Family Medicine

## 2021-05-24 DIAGNOSIS — M545 Low back pain, unspecified: Secondary | ICD-10-CM

## 2021-06-09 ENCOUNTER — Other Ambulatory Visit: Payer: Self-pay | Admitting: Nurse Practitioner

## 2021-06-09 DIAGNOSIS — M5430 Sciatica, unspecified side: Secondary | ICD-10-CM

## 2021-06-18 ENCOUNTER — Inpatient Hospital Stay: Admission: RE | Admit: 2021-06-18 | Payer: Medicare Other | Source: Ambulatory Visit

## 2021-06-25 ENCOUNTER — Inpatient Hospital Stay: Admission: RE | Admit: 2021-06-25 | Payer: Medicare Other | Source: Ambulatory Visit

## 2021-07-02 ENCOUNTER — Inpatient Hospital Stay: Admission: RE | Admit: 2021-07-02 | Payer: Medicare Other | Source: Ambulatory Visit

## 2021-08-04 ENCOUNTER — Other Ambulatory Visit (HOSPITAL_COMMUNITY): Payer: Self-pay | Admitting: Family Medicine

## 2021-08-04 DIAGNOSIS — Z1231 Encounter for screening mammogram for malignant neoplasm of breast: Secondary | ICD-10-CM

## 2021-08-18 ENCOUNTER — Ambulatory Visit (HOSPITAL_COMMUNITY)
Admission: RE | Admit: 2021-08-18 | Discharge: 2021-08-18 | Disposition: A | Discharge status: Home / Self Care | Payer: Medicare Other | Source: Ambulatory Visit | Attending: Family Medicine | Admitting: Family Medicine

## 2021-08-18 DIAGNOSIS — Z1231 Encounter for screening mammogram for malignant neoplasm of breast: Secondary | ICD-10-CM | POA: Insufficient documentation

## 2021-09-08 ENCOUNTER — Encounter: Payer: Medicare Other | Attending: Family Medicine | Admitting: Nutrition

## 2021-09-08 VITALS — Ht 62.0 in | Wt 285.0 lb

## 2021-09-08 DIAGNOSIS — Z6841 Body Mass Index (BMI) 40.0 and over, adult: Secondary | ICD-10-CM | POA: Insufficient documentation

## 2021-09-08 NOTE — Progress Notes (Signed)
Medical Nutrition Therapy  Appointment Start time:  [phone]  Appointment End time:  1100  Primary concerns today: Severe Obesity  Referral diagnosis: E66.01 Preferred learning style Ready    NUTRITION ASSESSMENT  66 yr old bfemale here for weight loss. She notes she has been on different weight loss plans and medications and she regains the weight back every time. She is on Moungaro right now. She has lost 25+ lbs on it in the last 4 months. She doesn't have an appetite. She admits to being depressed and would like to work with a counselor to help her with her despression, anxiety and emotional eating. She lives by herself and she notes she stays at home and eats whenever she wants. Often times, eats in her bed.   Doesn't cook. Her niece brings her food at times. Otherwise, she is eating fast foods.  Willing to work on making better lifestyle choices. 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   Anthropometrics  Wt Readings from Last 3 Encounters:  09/08/21 285 lb (129.3 kg)  04/14/20 (!) 314 lb (142.4 kg)  06/27/13 272 lb (123.4 kg)   Ht Readings from Last 3 Encounters:  09/08/21 5\' 2"  (1.575 m)  04/14/20 5\' 2"  (1.575 m)  06/27/13 5\' 2"  (1.575 m)   Body mass index is 52.13 kg/m. @BMIFA @ Facility age limit for growth %iles is 20 years. Facility age limit for growth %iles is 20 years.    Clinical Medical Hx: Hypothryoid, Osteoarthritis, COP:D, Knee pain, Asthma,Diverticulosis, Hyperlipidemia, Anxiety, depression, GERD, HTN, Insomnia. Medications: See list Labs: none for review Notable Signs/Symptoms: fatigue, depression, anxiety, loniness  Lifestyle & Dietary Hx Lives by herself. Cooks some and eats out mostly.   Estimated daily fluid intake: 30 oz Supplements:  Sleep: 7 hours Not a morning person. Stays up late at night. Stress / self-care: None Current average weekly physical activity: ADL,   24-Hr Dietary Recall Eats 2 meals and  snacks during the day. Stays up and eats late at night.  Estimated Energy Needs Calories: 1200 Carbohydrate: 135g Protein: 90g Fat: 33g   NUTRITION DIAGNOSIS  NB-1.1 Food and nutrition-related knowledge deficit As related to Obesity.  As evidenced by BMI > 50.   NUTRITION INTERVENTION  Nutrition education (E-1) on the following topics:  Lifestyle Medicine  - Whole Food, Plant Predominant Nutrition is highly recommended: Eat Plenty of vegetables, Mushrooms, fruits, Legumes, Whole Grains, Nuts, seeds in lieu of processed meats, processed snacks/pastries red meat, poultry, eggs.    -It is better to avoid simple carbohydrates including: Cakes, Sweet Desserts, Ice Cream, Soda (diet and regular), Sweet Tea, Candies, Chips, Cookies, Store Bought Juices, Alcohol in Excess of  1-2 drinks a day, Lemonade,  Artificial Sweeteners, Doughnuts, Coffee Creamers, "Sugar-free" Products, etc, etc.  This is not a complete list.....  Exercise: If you are able: 30 -60 minutes a day ,4 days a week, or 150 minutes a week.  The longer the better.  Combine stretch, strength, and aerobic activities.  If you were told in the past that you have high risk for cardiovascular diseases, you may seek evaluation by your heart doctor prior to initiating moderate to intense exercise programs.   Handouts Provided Include  Lifestyle Medicine packet  Learning Style & Readiness for Change Teaching method utilized: Visual & Auditory  Demonstrated degree of understanding via: Teach Back  Barriers to learning/adherence to lifestyle change: Mental readiness.  Goals Established by Pt   Goals  Cut out fat back and use healthy oils Eat three meals per day' B 8/9 L)12-3 D) 5-7 Increase plant based foods of fruits, vegetables and whole grain. Go to [email or web address].org website Lose 1-2 lbs per week Call MD to get referred to a counselor. MONITORING & EVALUATION Dietary intake, weekly physical activity, and weight in 1  month.  Next Steps  Patient is to work on eating 3 balanced meals of whole plant based foods.Marland Kitchen

## 2021-09-08 NOTE — Patient Instructions (Signed)
Goals  Cut out fat back and use healthy oils Eat three meals per day' B 8/9 L)12-3 D) 5-7 Increase plant based foods of fruits, vegetables and whole grain. Go to [email or web address].[email or web address] website Lose 1-2 lbs per week Call MD to get referred to a counselor.

## 2021-10-11 ENCOUNTER — Encounter: Payer: Self-pay | Admitting: Nutrition

## 2021-10-20 ENCOUNTER — Encounter: Payer: Self-pay | Admitting: Nutrition

## 2021-10-20 ENCOUNTER — Encounter: Payer: Medicare Other | Attending: Family Medicine | Admitting: Nutrition

## 2021-10-20 VITALS — Ht 62.0 in | Wt 278.3 lb

## 2021-10-20 DIAGNOSIS — E782 Mixed hyperlipidemia: Secondary | ICD-10-CM | POA: Insufficient documentation

## 2021-10-20 DIAGNOSIS — I10 Essential (primary) hypertension: Secondary | ICD-10-CM | POA: Insufficient documentation

## 2021-10-20 DIAGNOSIS — Z6841 Body Mass Index (BMI) 40.0 and over, adult: Secondary | ICD-10-CM | POA: Insufficient documentation

## 2021-10-20 NOTE — Patient Instructions (Addendum)
Goals  Work on meal planning and meal prepping. Go to to the [facility] 2-3 time peek. Try to find somewhere to volunteer Increase fresh fruits and vegetables Lose 2lbs per month.

## 2021-10-20 NOTE — Progress Notes (Signed)
Medical Nutrition Therapy  Appointment Start time: 1300 Appointment End time:  1330  Primary concerns today: Severe Obesity  Referral diagnosis: E66.01 Preferred learning style Ready    NUTRITION ASSESSMENT Obesity follow up  66 yr old bfemale here for weight loss.  She lost 7 lbs since last visit. She is on Mounjero. Changes made: has cut down on eating sweets. Eating more fruit. Has cut out eating after 7 pm. Drinking more water. Has been doing some chair exercises. Joined the [employer] Has started watching videos on [email or web address].[email or web address]  Goals previously set:  Cut out fat back and use healthy oils-still working on it. Eat three meals per day' B 8/9 L)12-3 D) 5-7- still has to work on it. Increase plant based foods of fruits, vegetables and whole grain-done Go to [email or web address].org website- going to do that today Lose 1-2 lbs per week Call MD to get referred to a counselor-called and waiting for appt.  Willing to work on making better lifestyle choices.    Anthropometrics  Wt Readings from Last 3 Encounters:  10/20/21 278 lb 4.8 oz (126.2 kg)  09/08/21 285 lb (129.3 kg)  04/14/20 (!) 314 lb (142.4 kg)   Ht Readings from Last 3 Encounters:  10/20/21 5\' 2"  (1.575 m)  09/08/21 5\' 2"  (1.575 m)  04/14/20 5\' 2"  (1.575 m)   Body mass index is 50.9 kg/m. @BMIFA @ Facility age limit for growth %iles is 20 years. Facility age limit for growth %iles is 20 years.    Clinical Medical Hx: Hypothryoid, Osteoarthritis, COP:D, Knee pain, Asthma,Diverticulosis, Hyperlipidemia, Anxiety, depression, GERD, HTN, Insomnia. Medications: See list Labs: none for review Notable Signs/Symptoms: fatigue, depression, anxiety, loniness  Lifestyle & Dietary Hx Lives by herself. Cooks some and eats out mostly.   Estimated daily fluid intake: 30 oz Supplements:  Sleep: 7 hours Not a morning person. Stays up late at night. Stress / self-care: None Current average weekly physical activity: ADL,    24-Hr Dietary Recall B) eggs and cereal or eggs and toast L) misc  water.  She has been moving and not cooking much right now. D misc  water She has been moving and not cooking much right now.  Estimated Energy Needs Calories: 1200 Carbohydrate: 135g Protein: 90g Fat: 33g   NUTRITION DIAGNOSIS  NB-1.1 Food and nutrition-related knowledge deficit As related to Obesity.  As evidenced by BMI > 50.   NUTRITION INTERVENTION  Nutrition education (E-1) on the following topics:  Lifestyle Medicine  - Whole Food, Plant Predominant Nutrition is highly recommended: Eat Plenty of vegetables, Mushrooms, fruits, Legumes, Whole Grains, Nuts, seeds in lieu of processed meats, processed snacks/pastries red meat, poultry, eggs.    -It is better to avoid simple carbohydrates including: Cakes, Sweet Desserts, Ice Cream, Soda (diet and regular), Sweet Tea, Candies, Chips, Cookies, Store Bought Juices, Alcohol in Excess of  1-2 drinks a day, Lemonade,  Artificial Sweeteners, Doughnuts, Coffee Creamers, "Sugar-free" Products, etc, etc.  This is not a complete list.....  Exercise: If you are able: 30 -60 minutes a day ,4 days a week, or 150 minutes a week.  The longer the better.  Combine stretch, strength, and aerobic activities.  If you were told in the past that you have high risk for cardiovascular diseases, you may seek evaluation by your heart doctor prior to initiating moderate to intense exercise programs.   Handouts Provided Include  Lifestyle Medicine packet  Learning Style & Readiness for Change Teaching method utilized: Visual & Auditory  Demonstrated degree of  understanding via: Teach Back  Barriers to learning/adherence to lifestyle change: Mental readiness.  Goals Established by Pt  Goals  Work on meal planning and meal prepping. Go to to the [facility] 2-3 time peek. Try to find somewhere to volunteer Increase fresh fruits and vegetables Lose 2lbs per month.     MONITORING &  EVALUATION Dietary intake, weekly physical activity, and weight in 3 month.  Next Steps  Patient is to work on eating 3 balanced meals of whole plant based foods.Marland Kitchen

## 2021-10-22 ENCOUNTER — Telehealth (HOSPITAL_COMMUNITY): Payer: Self-pay

## 2021-10-22 NOTE — Telephone Encounter (Signed)
Called pt to schedule appt for therapy no answer left vm

## 2021-10-22 NOTE — Telephone Encounter (Signed)
Pt called back and was scheduled.

## 2021-11-25 ENCOUNTER — Ambulatory Visit (INDEPENDENT_AMBULATORY_CARE_PROVIDER_SITE_OTHER): Payer: Medicare Other | Admitting: Psychiatry

## 2021-11-25 ENCOUNTER — Encounter (HOSPITAL_COMMUNITY): Payer: Self-pay | Admitting: Psychiatry

## 2021-11-25 DIAGNOSIS — F321 Major depressive disorder, single episode, moderate: Secondary | ICD-10-CM
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
## 2021-12-16 ENCOUNTER — Ambulatory Visit (INDEPENDENT_AMBULATORY_CARE_PROVIDER_SITE_OTHER): Payer: Medicare Other | Admitting: Psychiatry

## 2021-12-16 ENCOUNTER — Encounter (HOSPITAL_COMMUNITY): Payer: Self-pay

## 2021-12-16 DIAGNOSIS — F321 Major depressive disorder, single episode, moderate: Secondary | ICD-10-CM | POA: Diagnosis not present

## 2021-12-16 NOTE — Progress Notes (Signed)
IN-PERSON  THERAPIST PROGRESS NOTE  Session Time: Thursday  12/16/2021 11:27 AM - 12:25 PM   Participation Level: Active  Behavioral Response: CasualAlertDepressed  Type of Therapy: Individual Therapy  Treatment Goals addressed:  Reduce frequency, intensity, and duration of depression symptoms so that daily functioning is improved AEB by reducing symptom frequency from 3 x per week to 1 x per week per self-report     Breanna [name] will practice behavioral activation skills 3-4  times per week for the next 12 weeks weeks   ProgressTowards Goals: Initial  Interventions: CBT and Supportive  Summary: Breanna [name] is a 66 y.o. female who is referred for services by PA from Dr. Emerson Monte office due to pt experiencing grief and loss issues. Per pt's report, she used to see Dr. Omelia Blackwater who previously dx'd pt with bipolar disorder and schizophrenia. PCP currently providing medication management for pt. She reports no involvement with any other behavioral health providers. She denies any psychiatric hospitalizations.  Patient reports being referred by PCP based on recommendations from her nutritionist.  Patient reports recently having a conversation with nutritionist about her grandmother who died many many years ago.  Patient also reports worries about her son who is incarcerated.  He is sick and she cannot help him per her report.  She also reports just worrying about all her children who are all adults.  Patient's current symptoms include sadness, difficulty concentrating, fatigue, hopelessness, irritability, tearfulness, worthlessness, sleep difficulty.   Patient last was seen 2 to 3 weeks ago for the assessment appointment.  She has experienced significant decrease in frequency/intensity of symptoms of depression as reflected in the PHQ 2 and 9.  Patient attributes this to being involved in more activities including helping her ex-husband who recently was hospitalized.  She also reports planning to do things  with her daughter.  Patient states realizing that she seems to feel better when she is busy.  However, she worries how she will feel when she has nothing else to do.  She continues to worry about son who is incarcerated as he is sick.  Suicidal/Homicidal: Negativewithout intent/plan  Therapist Response: Reviewed symptoms, administered PHQ 2 and 9, praised and reinforced patient's increased behavioral activation, assisted patient identify connection between mood and involvement in activity, discussed stressors, facilitated expression of thoughts and feelings, validated feelings, developed treatment plan, obtained patient's permission to electronically signed plan for patient, provided patient with copy, began to orient patient to CBT  Plan: Return again in 2 weeks.  Diagnosis: Major depressive disorder, single episode, moderate (HCC)  Collaboration of Care: Primary Care Provider AEB patient works with PCP Dr. Sudie Bailey for medication management  Patient/Guardian was advised Release of Information must be obtained prior to any record release in order to collaborate their care with an outside provider. Patient/Guardian was advised if they have not already done so to contact the registration department to sign all necessary forms in order for [place] to release information regarding their care.   Consent: Patient/Guardian gives verbal consent for treatment and assignment of benefits for services provided during this visit. Patient/Guardian expressed understanding and agreed to proceed.   Adah Salvage, LCSW 12/16/2021

## 2021-12-22 ENCOUNTER — Ambulatory Visit: Payer: Medicare Other | Admitting: Nutrition

## 2022-01-17 ENCOUNTER — Ambulatory Visit (INDEPENDENT_AMBULATORY_CARE_PROVIDER_SITE_OTHER): Payer: 59 | Admitting: Psychiatry

## 2022-01-17 DIAGNOSIS — F321 Major depressive disorder, single episode, moderate: Secondary | ICD-10-CM

## 2022-01-17 NOTE — Progress Notes (Signed)
IN-PERSON  THERAPIST PROGRESS NOTE  Session Time: Monday 01/17/2021 2:14 PM - 3:00 PM   Participation Level: Active  Behavioral Response: CasualAlertDepressed  Type of Therapy: Individual Therapy  Treatment Goals addressed:  Reduce frequency, intensity, and duration of depression symptoms so that daily functioning is improved AEB by reducing symptom frequency from 3 x per week to 1 x per week per self-report     Bernis will practice behavioral activation skills 3-4  times per week for the next 12 weeks weeks   ProgressTowards Goals: Initial  Interventions: CBT and Supportive  Summary: LIBBIE BARTLEY is a 67 y.o. female who is referred for services by PA from Dr. Sofie Hartigan office due to pt experiencing grief and loss issues. Per pt's report, she used to see Dr. Rosine Door who previously dx'd pt with bipolar disorder and schizophrenia. PCP currently providing medication management for pt. She reports no involvement with any other behavioral health providers. She denies any psychiatric hospitalizations.  Patient reports being referred by PCP based on recommendations from her nutritionist.  Patient reports recently having a conversation with nutritionist about her grandmother who died many many years ago.  Patient also reports worries about her son who is incarcerated.  He is sick and she cannot help him per her report.  She also reports just worrying about all her children who are all adults.  Patient's current symptoms include sadness, difficulty concentrating, fatigue, hopelessness, irritability, tearfulness, worthlessness, sleep difficulty.   Patient last was seen about 2 weeks ago.  She states being okay but experiencing a few days of being down.  Triggers include relationship with ex-husband and disappointment regarding an issue with church.  Patient expresses frustration as she wants to set maintain limits with her involvement with her ex-husband.  She reports frequently taking him to medical  appointments at her daughter's request as she does not want her daughter to worry about her father.  She expresses frustration regarding her recent church interaction as she felt she was excluded intentionally when the pastor gave out gives to everyone except her.  Patient admits it could have been an oversight but has decided against returning to the church.  Patient still reports poor motivation and difficulty involving activity.   Suicidal/Homicidal: Negativewithout intent/plan  Therapist Response: Reviewed symptoms, discussed stressors, facilitated expression of thoughts and feelings, validated feelings, began to assist patient examine her thought patterns and the effects on her mood and behavior, assisted patient began to identify ways to set and maintain limits with ex-husband particularly regarding phone calls, discussed the role of behavioral activation in coping with depression, discussed rationale for and provided instructions on how to use daily planning, develop plan with patient to use daily planning forms to increase behavioral activation, bring forms back to next session  Plan: Return again in 2 weeks.  Diagnosis: Major depressive disorder, single episode, moderate ([place])  Collaboration of Care: Primary Care Provider AEB patient works with PCP Dr. Karie Kirks for medication management  Patient/Guardian was advised Release of Information must be obtained prior to any record release in order to collaborate their care with an outside provider. Patient/Guardian was advised if they have not already done so to contact the registration department to sign all necessary forms in order for [place] to release information regarding their care.   Consent: Patient/Guardian gives verbal consent for treatment and assignment of benefits for services provided during this visit. Patient/Guardian expressed understanding and agreed to proceed.   Alonza Smoker, LCSW 01/17/2022

## 2022-01-31 ENCOUNTER — Ambulatory Visit (INDEPENDENT_AMBULATORY_CARE_PROVIDER_SITE_OTHER): Payer: 59 | Admitting: Psychiatry

## 2022-01-31 DIAGNOSIS — F321 Major depressive disorder, single episode, moderate: Secondary | ICD-10-CM | POA: Diagnosis not present
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
## 2022-02-08 ENCOUNTER — Ambulatory Visit: Payer: Medicare Other | Admitting: Nutrition

## 2022-02-14 ENCOUNTER — Ambulatory Visit (HOSPITAL_COMMUNITY): Payer: 59 | Admitting: Psychiatry

## 2022-02-15 ENCOUNTER — Encounter: Payer: 59 | Attending: Family Medicine | Admitting: Nutrition

## 2022-02-21 ENCOUNTER — Encounter: Payer: Self-pay | Admitting: *Deleted

## 2022-02-22 ENCOUNTER — Telehealth: Payer: Self-pay | Admitting: Internal Medicine

## 2022-02-22 ENCOUNTER — Ambulatory Visit (INDEPENDENT_AMBULATORY_CARE_PROVIDER_SITE_OTHER): Payer: 59

## 2022-02-22 ENCOUNTER — Ambulatory Visit: Payer: 59 | Attending: Internal Medicine | Admitting: Internal Medicine

## 2022-02-22 ENCOUNTER — Encounter: Payer: Self-pay | Admitting: Internal Medicine

## 2022-02-22 ENCOUNTER — Other Ambulatory Visit: Payer: Self-pay | Admitting: Internal Medicine

## 2022-02-22 VITALS — BP 100/60 | HR 68 | Ht 62.0 in | Wt 260.6 lb

## 2022-02-22 DIAGNOSIS — R002 Palpitations: Secondary | ICD-10-CM

## 2022-02-22 DIAGNOSIS — E785 Hyperlipidemia, unspecified: Secondary | ICD-10-CM | POA: Insufficient documentation

## 2022-02-22 DIAGNOSIS — I10 Essential (primary) hypertension: Secondary | ICD-10-CM | POA: Diagnosis not present

## 2022-02-22 DIAGNOSIS — E7849 Other hyperlipidemia: Secondary | ICD-10-CM

## 2022-02-22 DIAGNOSIS — G4733 Obstructive sleep apnea (adult) (pediatric): Secondary | ICD-10-CM

## 2022-02-22 DIAGNOSIS — I493 Ventricular premature depolarization: Secondary | ICD-10-CM | POA: Diagnosis not present

## 2022-02-22 NOTE — Telephone Encounter (Signed)
PERCERT  7 Day ZIO XT dx: palpitations

## 2022-02-22 NOTE — Telephone Encounter (Signed)
Patient calling in bout the heart monitor, to get it activated. Please advise

## 2022-02-22 NOTE — Progress Notes (Signed)
Cardiology Office Note  Date: 02/22/2022   ID: Breanna, [name] 1955-08-06, MRN [id number]  PCP:  Breanna Evens, MD  Cardiologist:  Breanna Guest, MD Electrophysiologist:  None   Reason for Office Visit: Evaluation of PVCs at the request of Dr. Karie [name]   History of Present Illness: Breanna [name] is a 67 y.o. female known to have HTN, HLD, OSA not on CPAP was referred to cardiology clinic for evaluation of PVCs.  Patient has been having palpitations especially at nighttime few times per week and lasting for a few minutes. It used to occur every day in the past but now it is happening a few times per week. EKG performed at her PCP's office and today in the clinic showed normal sinus rhythm and frequent PVCs.  She denies any symptoms of chest pain, SOB, leg swelling, claudication, dizziness and syncope symptoms.  Denies smoking cigarettes.  She has OSA but does not use CPAP as frequently as she is supposed to.  Past Medical History:  Diagnosis Date   GERD (gastroesophageal reflux disease)    Hypercholesterolemia    Hypertension    Neuropathy    Osteoarthritis    Pain    Sleep apnea     Past Surgical History:  Procedure Laterality Date   ABDOMINAL HYSTERECTOMY     TUBAL LIGATION      Current Outpatient Medications  Medication Sig Dispense Refill   albuterol (VENTOLIN HFA) 108 (90 Base) MCG/ACT inhaler Inhale 2-3 puffs into the lungs 3 (three) times daily.     ALPRAZolam (XANAX) 1 MG tablet Take 1 mg by mouth at bedtime as needed for anxiety.     cetirizine (ZYRTEC) 10 MG tablet Take 10 mg by mouth daily.     diclofenac Sodium (VOLTAREN) 1 % GEL Apply 2 g topically in the morning, at noon, and at bedtime.     fluticasone (FLONASE) 50 MCG/ACT nasal spray Place 2 sprays into both nostrils daily.     furosemide (LASIX) 40 MG tablet Take 40 mg by mouth as needed for fluid or edema.     gabapentin (NEURONTIN) 300 MG capsule Take 300 mg by mouth 3 (three) times daily.      hydrochlorothiazide (HYDRODIURIL) 25 MG tablet Take 25 mg by mouth as needed (for fluid).     HYDROcodone-acetaminophen (NORCO) 10-325 MG tablet Take 1 tablet by mouth every 6 (six) hours as needed.     LINZESS 145 MCG CAPS capsule Take 145 mcg by mouth as needed (constipation).     lovastatin (ALTOPREV) 40 MG 24 hr tablet Take 40 mg by mouth at bedtime.     pantoprazole (PROTONIX) 40 MG tablet Take 40 mg by mouth daily.     potassium chloride (MICRO-K) 10 MEQ CR capsule Take 20 mEq by mouth daily.     promethazine (PHENERGAN) 25 MG tablet Take 1 tablet (25 mg total) by mouth every 6 (six) hours as needed for nausea or vomiting. 30 tablet 0   QUEtiapine (SEROQUEL) 100 MG tablet Take 100 mg by mouth at bedtime.     zolpidem (AMBIEN) 10 MG tablet Take 10 mg by mouth at bedtime as needed for sleep.     No current facility-administered medications for this visit.   Allergies:  Patient has no known allergies.   Social History: The patient  reports that she has never smoked. She does not have any smokeless tobacco history on file. She reports that she does not drink alcohol and does  not use drugs.   Family History: The patient's family history includes Anxiety disorder in her mother; Depression in her mother; Schizophrenia in her father.   ROS:  Please see the history of present illness. Otherwise, complete review of systems is positive for none.  All other systems are reviewed and negative.   Physical Exam: VS:  BP 100/60   Pulse 68   Ht 5' 2"$  (1.575 m)   Wt 260 lb 9.6 oz (118.2 kg)   SpO2 96%   BMI 47.66 kg/m , BMI Body mass index is 47.66 kg/m.  Wt Readings from Last 3 Encounters:  02/22/22 260 lb 9.6 oz (118.2 kg)  10/20/21 278 lb 4.8 oz (126.2 kg)  09/08/21 285 lb (129.3 kg)    General: Patient appears comfortable at rest. HEENT: Conjunctiva and lids normal, oropharynx clear with moist mucosa. Neck: Supple, no elevated JVP or carotid bruits, no thyromegaly. Lungs: Clear to  auscultation, nonlabored breathing at rest. Cardiac: Regular rate and rhythm, no S3 or significant systolic murmur, no pericardial rub. Abdomen: Soft, nontender, no hepatomegaly, bowel sounds present, no guarding or rebound. Extremities: No pitting edema, distal pulses 2+. Skin: Warm and dry. Musculoskeletal: No kyphosis. Neuropsychiatric: Alert and oriented x3, affect grossly appropriate.  ECG:  An ECG dated 02/22/2022 was personally reviewed today and demonstrated:  Normal sinus with with frequent PVCs  Recent Labwork: No results found for requested labs within last 365 days.  No results found for: "CHOL", "TRIG", "HDL", "CHOLHDL", "VLDL", "LDLCALC", "LDLDIRECT"  Other Studies Reviewed Today: I personally reviewed the heart care referral records from the PCP  Assessment and Plan: Patient is a 67 year old F known to have HTN, HLD, OSA not on CPAP was referred to cardiology clinic for evaluation of PVCs.  # Frequent PVCs, symptomatic -Obtain 1 week event monitor to quantify the burden of PVCs.  If frequent, she will benefit from addition of metoprolol tartrate 25 mg twice daily. -Does not drink coffee. Treat the underlying etiology of OSA with CPAP.  Patient motivated to try and be compliant with CPAP therapy.  # OSA not on CPAP -Encouraged to be compliant with CPAP to prevent frequent PVCs.  Patient voiced understanding and is motivated to try the CPAP.  # HTN, controlled -Continue losartan 25 mg once daily -HTN management per PCP  # HLD -Continue lovastatin 40 mg nightly -HLD management per PCP -Goal LDL less than 100  I have spent a total of 45 minutes with patient reviewing chart, EKGs, labs and examining patient as well as establishing an assessment and plan that was discussed with the patient.  > 50% of time was spent in direct patient care.      Medication Adjustments/Labs and Tests Ordered: Current medicines are reviewed at length with the patient today.  Concerns  regarding medicines are outlined above.   Tests Ordered: No orders of the defined types were placed in this encounter.   Medication Changes: No orders of the defined types were placed in this encounter.   Disposition:  Follow up  1 year  Signed Emerick Weatherly Fidel Levy, MD, 02/22/2022 1:15 PM    [facility] [place], [place], [place] [address]

## 2022-02-22 NOTE — Telephone Encounter (Signed)
Advised that monitor already activated.

## 2022-02-22 NOTE — Patient Instructions (Addendum)
Medication Instructions:  Your physician recommends that you continue on your current medications as directed. Please refer to the Current Medication list given to you today.  Labwork: none  Testing/Procedures: Your physician has recommended that you wear a Zio monitor.   This monitor is a medical device that records the heart's electrical activity. Doctors most often use these monitors to diagnose arrhythmias. Arrhythmias are problems with the speed or rhythm of the heartbeat. The monitor is a small device applied to your chest. You can wear one while you do your normal daily activities. While wearing this monitor if you have any symptoms to push the button and record what you felt. Once you have worn this monitor for the period of time provider prescribed (for 7 days), you will return the monitor device in the postage paid box. Once it is returned they will download the data collected and provide [place] with a report which the provider will then review and we will call you with those results. Important tips:  Avoid showering during the first 24 hours of wearing the monitor. Avoid excessive sweating to help maximize wear time. Do not submerge the device, no hot tubs, and no swimming pools. Keep any lotions or oils away from the patch. After 24 hours you may shower with the patch on. Take brief showers with your back facing the shower head.  Do not remove patch once it has been placed because that will interrupt data and decrease adhesive wear time. Push the button when you have any symptoms and write down what you were feeling. Once you have completed wearing your monitor, remove and place into box which has postage paid and place in your outgoing mailbox.  If for some reason you have misplaced your box then call our office and we can provide another box and/or mail it off for you.  Follow-Up: Your physician recommends that you schedule a follow-up appointment in: 1 year. You will receive a  reminder call in the mail in about 10 months reminding you to call and schedule your appointment. If you don't receive this call, please contact our office.  Any Other Special Instructions Will Be Listed Below (If Applicable).  If you need a refill on your cardiac medications before your next appointment, please call your pharmacy.

## 2022-02-28 ENCOUNTER — Ambulatory Visit (INDEPENDENT_AMBULATORY_CARE_PROVIDER_SITE_OTHER): Payer: 59 | Admitting: Psychiatry

## 2022-02-28 DIAGNOSIS — F321 Major depressive disorder, single episode, moderate: Secondary | ICD-10-CM

## 2022-02-28 NOTE — Progress Notes (Unsigned)
IN-PERSON  THERAPIST PROGRESS NOTE  Session Time: Monday 02/23/2022 2:10 PM -  Participation Level: Active  Behavioral Response: CasualAlertDepressed  Type of Therapy: Individual Therapy  Treatment Goals addressed:  Reduce frequency, intensity, and duration of depression symptoms so that daily functioning is improved AEB by reducing symptom frequency from 3 x per week to 1 x per week per self-report     Breanna [name] will practice behavioral activation skills 3-4  times per week for the next 12 weeks weeks   ProgressTowards Goals: Progressing  Interventions: CBT and Supportive  Summary: Breanna [name] is a 67 y.o. female who is referred for services by PA from Dr. Sofie Hartigan office due to pt experiencing grief and loss issues. Per pt's report, she used to see Dr. Rosine Door who previously dx'd pt with bipolar disorder and schizophrenia. PCP currently providing medication management for pt. She reports no involvement with any other behavioral health providers. She denies any psychiatric hospitalizations.  Patient reports being referred by PCP based on recommendations from her nutritionist.  Patient reports recently having a conversation with nutritionist about her grandmother who died many many years ago.  Patient also reports worries about her son who is incarcerated.  He is sick and she cannot help him per her report.  She also reports just worrying about all her children who are all adults.  Patient's current symptoms include sadness, difficulty concentrating, fatigue, hopelessness, irritability, tearfulness, worthlessness, sleep difficulty.   Patient last was seen about 2 weeks ago.  She reports experiencing increased symptoms of frequency nearly every day since last session.  Stressors include frustration about getting her medication.  She has made 3 appeals to try to get [facility].  Patient fears she will gain the 55 pounds she has lost in the past 6 months.  She is hopeful the last appeal will be  successful.  She also reports worry about her daughter who has been sick with the flu but is getting better.  Patient reports additional stress related to another incident at church between her daughter and another member.  Patient states thoughts of nothing going right.  Patient reports forgetting to do daily planning and reports little to no involvement in activity.  She also reports poor sleep hygiene.  She is pleased she recently met with someone about a volunteer opportunity to help give out  clothing and other items to the less fortunate.  She reports she had an interview today is waiting for follow-up.    Suicidal/Homicidal: Negativewithout intent/plan  Therapist Response: Reviewed symptoms, praised and reinforced patient's efforts to pursue volunteer opportunity, discussed stressors, facilitated expression of thoughts and feelings, validated feelings, oriented patient to CBT, reviewed rationale for using daily planning, developed plan with patient to begin using daily planning forms and bring to next session, assisted patient identify and address thoughts and processes that may inhibit implementation of plan, also assisted patient identify ways to improve sleep hygiene by establishing consistent going to bedtime of 11:30 PM and getting up around 8 AM   Plan: Return again in 2 weeks.  Diagnosis: Major depressive disorder, single episode, moderate ([place])  Collaboration of Care: Primary Care Provider AEB patient works with PCP Dr. Karie Kirks for medication management  Patient/Guardian was advised Release of Information must be obtained prior to any record release in order to collaborate their care with an outside provider. Patient/Guardian was advised if they have not already done so to contact the registration department to sign all necessary forms in order for [place] to release  information regarding their care.   Consent: Patient/Guardian gives verbal consent for treatment and assignment of benefits  for services provided during this visit. Patient/Guardian expressed understanding and agreed to proceed.   Alonza Smoker, LCSW 02/28/2022

## 2022-03-09 ENCOUNTER — Ambulatory Visit: Payer: 59 | Admitting: Nutrition

## 2022-03-11 ENCOUNTER — Telehealth: Payer: Self-pay | Admitting: Internal Medicine

## 2022-03-11 ENCOUNTER — Encounter: Payer: Self-pay | Admitting: *Deleted

## 2022-03-11 MED ORDER — APIXABAN 5 MG PO TABS: 5.0000 mg | ORAL_TABLET | Freq: Two times a day (BID) | ORAL | 6 refills | Status: DC

## 2022-03-11 MED ORDER — METOPROLOL TARTRATE 25 MG PO TABS: 25.0000 mg | ORAL_TABLET | Freq: Two times a day (BID) | ORAL | 3 refills | Status: AC

## 2022-03-11 NOTE — Telephone Encounter (Signed)
Patient informed and verbalized understanding of plan. Copy sent to PCP Eliquis 30 day voucher faxed to [facility]: [id number] RxPCN: [id number] GRP: [id number] ID: [id number]

## 2022-03-11 NOTE — Telephone Encounter (Signed)
[employer] called to give abnormal [employer] results. While on hold trying to send a message to triage, [employer] disconnected the line

## 2022-03-11 NOTE — Telephone Encounter (Signed)
Breanna [name] with [employer] informed that result has been reviewed by provider.

## 2022-03-14 ENCOUNTER — Telehealth: Payer: Self-pay | Admitting: Internal Medicine

## 2022-03-14 NOTE — Telephone Encounter (Signed)
Advised that we did not have aspirin listed on profile. Advised to stop aspirin when she starts eliquis. Also advised that she would continue losartan and all other medications except aspirin. Verbalized understanding.

## 2022-03-14 NOTE — Telephone Encounter (Signed)
Pt c/o medication issue:  1. Name of Medication:   apixaban (ELIQUIS) 5 MG TABS tablet    metoprolol tartrate (LOPRESSOR) 25 MG tablet    2. How are you currently taking this medication (dosage and times per day)?   3. Are you having a reaction (difficulty breathing--STAT)? No  4. What is your medication issue? Pt would like a callback regarding whether she needs to continue taking Aspirin as well as Losartan being that she has started taking the above medications. Please advise

## 2022-03-17 ENCOUNTER — Encounter: Payer: Self-pay | Admitting: Internal Medicine

## 2022-03-17 ENCOUNTER — Ambulatory Visit: Payer: 59 | Attending: Internal Medicine | Admitting: Internal Medicine

## 2022-03-17 VITALS — BP 106/62 | HR 62 | Ht 62.0 in | Wt 269.0 lb

## 2022-03-17 DIAGNOSIS — I480 Paroxysmal atrial fibrillation: Secondary | ICD-10-CM

## 2022-03-17 DIAGNOSIS — I491 Atrial premature depolarization: Secondary | ICD-10-CM

## 2022-03-17 NOTE — Patient Instructions (Addendum)
Medication Instructions:   Your physician recommends that you continue on your current medications as directed. Please refer to the Current Medication list given to you today.  Labwork:  none  Testing/Procedures:  none  Follow-Up:  Your physician recommends that you schedule a follow-up appointment in: 3 months.  Any Other Special Instructions Will Be Listed Below (If Applicable).  If you need a refill on your cardiac medications before your next appointment, please call your pharmacy. 

## 2022-03-18 DIAGNOSIS — I491 Atrial premature depolarization: Secondary | ICD-10-CM | POA: Insufficient documentation

## 2022-03-18 DIAGNOSIS — I4891 Unspecified atrial fibrillation: Secondary | ICD-10-CM | POA: Insufficient documentation

## 2022-03-18 DIAGNOSIS — I480 Paroxysmal atrial fibrillation: Secondary | ICD-10-CM | POA: Insufficient documentation

## 2022-03-18 NOTE — Progress Notes (Signed)
Cardiology Office Note  Date: 03/18/2022   ID: Trixie, Clyburn 1955-07-23, MRN [id number]  PCP:  Lemmie Evens, MD  Cardiologist:  Chalmers Guest, MD Electrophysiologist:  None   Reason for Office Visit: Follow-up of A-fib   History of Present Illness: Breanna [name] is a 67 y.o. female known to have HTN, HLD, OSA not on CPAP presented to cardiology clinic for evaluation of palpitations.  Patient was initially referred to cardiology clinic in 02/2022 for evaluation of palpitations.  She has been having palpitations especially at nighttime, few times per week and lasting for few minutes.  She underwent event monitor in 03/2022 which showed 6% atrial fibrillation burden with the longest duration for 1 hour 48 minutes, HR range between 96 and 217 bpm, average HR 137 bpm. PAC burden is 13.2%, PVC burden is 2.2%. Patient symptomatic with A-fib, PAC and PVC. EKG performed so far including at PCPs office and in the clinic showed normal sinus rhythm, PAC and PVC. Metoprolol tartrate 25 mg twice daily and Eliquis 5 mg twice daily was started after event monitor was resulted on 03/11/2022. Palpitation frequency has decreased after starting metoprolol tartrate. Will evaluate her symptoms in the next clinic visit.  Otherwise, has no symptoms of chest pain, SOB, leg swelling, claudication, dizziness and syncope. Denies smoking cigarettes.  She has OSA but does not use CPAP as frequently as she is supposed to.  Past Medical History:  Diagnosis Date   GERD (gastroesophageal reflux disease)    Hypercholesterolemia    Hypertension    Neuropathy    Osteoarthritis    Pain    Sleep apnea     Past Surgical History:  Procedure Laterality Date   ABDOMINAL HYSTERECTOMY     TUBAL LIGATION      Current Outpatient Medications  Medication Sig Dispense Refill   albuterol (VENTOLIN HFA) 108 (90 Base) MCG/ACT inhaler Inhale 2-3 puffs into the lungs 3 (three) times daily.     ALPRAZolam (XANAX) 1 MG tablet  Take 1 mg by mouth at bedtime as needed for anxiety.     apixaban (ELIQUIS) 5 MG TABS tablet Take 1 tablet (5 mg total) by mouth 2 (two) times daily. 60 tablet 6   cetirizine (ZYRTEC) 10 MG tablet Take 10 mg by mouth daily.     diclofenac Sodium (VOLTAREN) 1 % GEL Apply 2 g topically in the morning, at noon, and at bedtime.     fluticasone (FLONASE) 50 MCG/ACT nasal spray Place 2 sprays into both nostrils daily.     furosemide (LASIX) 40 MG tablet Take 40 mg by mouth as needed for fluid or edema.     gabapentin (NEURONTIN) 300 MG capsule Take 300 mg by mouth 3 (three) times daily.     hydrochlorothiazide (HYDRODIURIL) 25 MG tablet Take 25 mg by mouth as needed (for fluid).     HYDROcodone-acetaminophen (NORCO) 10-325 MG tablet Take 1 tablet by mouth every 6 (six) hours as needed.     LINZESS 145 MCG CAPS capsule Take 145 mcg by mouth as needed (constipation).     losartan (COZAAR) 25 MG tablet Take 25 mg by mouth daily.     lovastatin (ALTOPREV) 40 MG 24 hr tablet Take 40 mg by mouth at bedtime.     metoprolol tartrate (LOPRESSOR) 25 MG tablet Take 1 tablet (25 mg total) by mouth 2 (two) times daily. 180 tablet 3   pantoprazole (PROTONIX) 40 MG tablet Take 40 mg by mouth daily.  potassium chloride (MICRO-K) 10 MEQ CR capsule Take 20 mEq by mouth daily.     promethazine (PHENERGAN) 25 MG tablet Take 1 tablet (25 mg total) by mouth every 6 (six) hours as needed for nausea or vomiting. 30 tablet 0   QUEtiapine (SEROQUEL) 100 MG tablet Take 100 mg by mouth at bedtime.     zolpidem (AMBIEN) 10 MG tablet Take 10 mg by mouth at bedtime as needed for sleep.     No current facility-administered medications for this visit.   Allergies:  Patient has no known allergies.   Social History: The patient  reports that she has never smoked. She does not have any smokeless tobacco history on file. She reports that she does not drink alcohol and does not use drugs.   Family History: The patient's family  history includes Anxiety disorder in her mother; Depression in her mother; Schizophrenia in her father.   ROS:  Please see the history of present illness. Otherwise, complete review of systems is positive for none.  All other systems are reviewed and negative.   Physical Exam: VS:  BP 106/62   Pulse 62   Ht '5\' 2"'$  (1.575 m)   Wt 269 lb (122 kg)   SpO2 96%   BMI 49.20 kg/m , BMI Body mass index is 49.2 kg/m.  Wt Readings from Last 3 Encounters:  03/17/22 269 lb (122 kg)  02/22/22 260 lb 9.6 oz (118.2 kg)  10/20/21 278 lb 4.8 oz (126.2 kg)    General: Patient appears comfortable at rest. HEENT: Conjunctiva and lids normal, oropharynx clear with moist mucosa. Neck: Supple, no elevated JVP or carotid bruits, no thyromegaly. Lungs: Clear to auscultation, nonlabored breathing at rest. Cardiac: Regular rate and rhythm, no S3 or significant systolic murmur, no pericardial rub. Abdomen: Soft, nontender, no hepatomegaly, bowel sounds present, no guarding or rebound. Extremities: No pitting edema, distal pulses 2+. Skin: Warm and dry. Musculoskeletal: No kyphosis. Neuropsychiatric: Alert and oriented x3, affect grossly appropriate.  ECG:  An ECG dated 02/22/2022 and 03/17/2022 was personally reviewed today and demonstrated:  Normal sinus with with frequent PVCs  Recent Labwork: No results found for requested labs within last 365 days.  No results found for: "CHOL", "TRIG", "HDL", "CHOLHDL", "VLDL", "LDLCALC", "LDLDIRECT"  Other Studies Reviewed Today: I personally reviewed the heart care referral records from the PCP  Assessment and Plan: Patient is a 67 year old F known to have new onset atrial fibrillation (diagnosed on event monitor from 03/2022), 13.2% PAC burden, 2.2% PVC burden, HTN, HLD, OSA not on CPAP was referred to cardiology clinic for evaluation of PVCs.  # Palpitations likely secondary to new onset atrial fibrillation, frequent PAC 13.2% and occasional PVC 2.2% -Patient  underwent event monitor in 03/2022 which showed 6% atrial fibrillation burden with the longest duration for 1 hour 48 minutes, HR range between 96 and 217 bpm, average HR 137 bpm. PAC burden is 13.2%, PVC burden is 2.2%. Patient symptomatic with A-fib, PAC and PVC. Continue metoprolol tartrate 25 mg twice daily, symptoms improved with this regimen. Will continue it for now. Continue Eliquis 5 mg twice daily.  Encourage patient to be compliant with CPAP, now she is motivated to try CPAP to prevent future recurrences of A-fib with RVR. EKG in the clinic today showed normal sinus rhythm, PAC and PVC.  # OSA not on CPAP -Encourage patient to be compliant with CPAP now she has a new diagnosis of atrial fibrillation, significant PAC burden and occasional PVCs. Patient motivated  and will try to use CPAP regularly now.  She is also educated that consistent CPAP use will prevent future recurrences of A-fib with RVR.  Weight loss counseling provided.  She is upset that she will not be qualified for [facility].  # HTN, controlled -Continue losartan 25 mg once daily, HTN management per PCP.  # HLD -Continue lovastatin 40 mg nightly, HLD management per PCP.  Goal LDL less than 100.  I have spent a total of 33 minutes with patient reviewing chart, EKGs, labs and examining patient as well as establishing an assessment and plan that was discussed with the patient.  > 50% of time was spent in direct patient care.      Medication Adjustments/Labs and Tests Ordered: Current medicines are reviewed at length with the patient today.  Concerns regarding medicines are outlined above.   Tests Ordered: Orders Placed This Encounter  Procedures   EKG 12-Lead    Medication Changes: No orders of the defined types were placed in this encounter.   Disposition:  Follow up  6 months  Signed Sheily Lineman Fidel Levy, MD, 03/18/2022 4:41 PM    [facility] [place], [place], [place]  [id number]

## 2022-03-30 ENCOUNTER — Ambulatory Visit (HOSPITAL_COMMUNITY): Payer: 59 | Admitting: Psychiatry

## 2022-04-13 ENCOUNTER — Ambulatory Visit (HOSPITAL_COMMUNITY): Payer: 59 | Admitting: Psychiatry

## 2022-04-13 ENCOUNTER — Telehealth (HOSPITAL_COMMUNITY): Payer: Self-pay | Admitting: Psychiatry

## 2022-04-13 NOTE — Telephone Encounter (Signed)
Therapist attempted to contact patient via phone call to home phone as well as mobile phone regarding scheduled in office appointment and received voicemail messages.  Therapist left messages about findings indicating attempt and requesting patient call office.

## 2022-04-26 ENCOUNTER — Encounter: Payer: 59 | Attending: Family Medicine | Admitting: Nutrition

## 2022-04-26 ENCOUNTER — Encounter: Payer: Self-pay | Admitting: Nutrition

## 2022-04-26 VITALS — Ht 62.0 in | Wt 278.8 lb

## 2022-04-26 DIAGNOSIS — I10 Essential (primary) hypertension: Secondary | ICD-10-CM

## 2022-04-26 DIAGNOSIS — Z6841 Body Mass Index (BMI) 40.0 and over, adult: Secondary | ICD-10-CM

## 2022-04-26 DIAGNOSIS — E782 Mixed hyperlipidemia: Secondary | ICD-10-CM | POA: Insufficient documentation

## 2022-04-26 NOTE — Progress Notes (Signed)
Medical Nutrition Therapy 1415   1500 Primary concerns today: Severe Obesity  Referral diagnosis: E66.01 Preferred learning style Ready    NUTRITION ASSESSMENT Obesity follow up No longer on Mounjero. Had gotten her weight down to 250-260's and insurance won't cover it anymore. So now she has gained about about 20 lbs back. She notes the mounjero helped her with not having the cravings and wanting to eat at night. Now her mind is back to thinking about food at night a lot and she snacks. Not exercising but willing to get back to the [facility] for water aerobics. Typically eats 1-2 meals per day. She realizes this is an issue. Admits to eating fast food and pick up drive thru stuff of pizza since she isn't cooking for herself at home. Willing to get back on track with the full plate living program.  Willing to work on making better lifestyle choices.   02/2022-- A1C 5.2%, TCHOL 177 mg/dl, HDL 51, TG 83 mg/dl and LDL 782 mg/dl.  Anthropometrics  Wt Readings from Last 3 Encounters:  04/26/22 278 lb 12.8 oz (126.5 kg)  03/17/22 269 lb (122 kg)  02/22/22 260 lb 9.6 oz (118.2 kg)   Ht Readings from Last 3 Encounters:  04/26/22  (1.575 m)  03/17/22  (1.575 m)  02/22/22  (1.575 m)   Body mass index is 50.99 kg/m. @ Facility age limit for growth %iles is 20 years. Facility age limit for growth %iles is 20 years.  Clinical Medical Hx: Hypothryoid, Osteoarthritis, COP:D, Knee pain, Asthma,Diverticulosis, Hyperlipidemia, Anxiety, depression, GERD, HTN, Insomnia. Medications: See list Labs: none for review Notable Signs/Symptoms: fatigue, depression, anxiety, loniness  Lifestyle & Dietary Hx Lives by herself. Cooks some and eats out mostly.   Estimated daily fluid intake: 30 oz Supplements:  Sleep: 7 hours Not a morning person. Stays up late at night. Stress / self-care: None Current average weekly physical activity: ADL,   24-Hr Dietary Recall Skips breakfast  and lunch and tends to eat a large dinner- fast food or pick up. Drinks water, soda, juice.  Estimated Energy Needs Calories: 1200 Carbohydrate: 135g Protein: 90g Fat: 33g   NUTRITION DIAGNOSIS  NB-1.1 Food and nutrition-related knowledge deficit As related to Obesity.  As evidenced by BMI > 50.   NUTRITION INTERVENTION  Nutrition education (E-1) on the following topics:  Emotional eating   Handouts Provided Include  Lifestyle Medicine packet  Learning Style & Readiness for Change Teaching method utilized: Visual & Auditory  Demonstrated degree of understanding via: Teach Back  Barriers to learning/adherence to lifestyle change: Mental readiness.  Goals Established by Pt  Work on meal planning and meal prep' Go to the [facility] 3 times per week Increase fresh fruits and vegetables Cut out eating after 7 pm Start sewing at night instead of watching TV Lose 2lbs per month.   MONITORING & EVALUATION Dietary intake, weekly physical activity, and weight in 3 month.  Next Steps  Patient is to work on eating 3 balanced meals of whole plant based foods.Marland Kitchen

## 2022-04-26 NOTE — Patient Instructions (Signed)
Work on meal planning and meal prep' Go to the [facility] 3 times per week Increase fresh fruits and vegetables Cut eating at [employer] sewing at night instead of watching TV Lose 2lbs per month.

## 2022-04-27 ENCOUNTER — Ambulatory Visit (HOSPITAL_COMMUNITY): Payer: 59 | Admitting: Psychiatry

## 2022-04-28 ENCOUNTER — Ambulatory Visit (INDEPENDENT_AMBULATORY_CARE_PROVIDER_SITE_OTHER): Payer: No Typology Code available for payment source | Admitting: Psychiatry

## 2022-04-28 DIAGNOSIS — F321 Major depressive disorder, single episode, moderate: Secondary | ICD-10-CM

## 2022-04-28 NOTE — Progress Notes (Signed)
IN-PERSON  THERAPIST PROGRESS NOTE  Session Time: Thursday 4/182024 1:12 PM  - 2:00 PM   Participation Level: Active  Behavioral Response: CasualAlertDepressed  Type of Therapy: Individual Therapy  Treatment Goals addressed:  Reduce frequency, intensity, and duration of depression symptoms so that daily functioning is improved AEB by reducing symptom frequency from 3 x per week to 1 x per week per self-report     Breanna [name] will practice behavioral activation skills 3-4  times per week for the next 12 weeks weeks   ProgressTowards Goals: Progressing  Interventions: CBT and Supportive  Summary: Breanna [name] is a 67 y.o. female who is referred for services by PA from Dr. Emerson Monte office due to pt experiencing grief and loss issues. Per pt's report, she used to see Dr. Omelia Blackwater who previously dx'd pt with bipolar disorder and schizophrenia. PCP currently providing medication management for pt. She reports no involvement with any other behavioral health providers. She denies any psychiatric hospitalizations.  Patient reports being referred by PCP based on recommendations from her nutritionist.  Patient reports recently having a conversation with nutritionist about her grandmother who died many many years ago.  Patient also reports worries about her son who is incarcerated.  He is sick and she cannot help him per her report.  She also reports just worrying about all her children who are all adults.  Patient's current symptoms include sadness, difficulty concentrating, fatigue, hopelessness, irritability, tearfulness, worthlessness, sleep difficulty.   Patient last was seen about 2 months ago.  She reports continued symptoms of anxiety and depression since last session but decreased intensity.  She reports decreased stress regarding recent weight gain as she states accepting knowing this is a result of no longer taking Mounjaro.  She now is hopeful about getting the medication as she has talked with her  doctor who wrote a new prescription.  However, there is a shortage on this medication at this time.  She reports experiencing periods of depression as she reports increased thoughts of not being able to do the things she used to do.  This sometimes results in thoughts of hopelessness.  She reports minimal involvement in activity during the day and says she has not been doing her chair exercises.  She reports improved sleep patterns this week as she has gone to bed earlier due to having morning appointments.  She reports this was helpful.   Suicidal/Homicidal: Negativewithout intent/plan  Therapist Response: Reviewed symptoms, discussed stressors, facilitated expression of thoughts and feelings, validated feelings, praised and reinforced patient's efforts to improve sleep patterns this week, discussed effects, encouraged patient to continue her efforts, reviewed role of behavioral activation in coping with depression, reviewed rationale for and provided instructions on the use of daily planning to increase behavioral activation, assisted patient identify value congruent activity 9 resuming attendance at church, reading the Bible, praying on the phone with her sister) to try to increase behavioral activation, also assisted patient identify her interests/hobbies (i.e. sewing), developed plan with patient to use daily planning, assisted patient identify and address thoughts and processes that may inhibit implementation of plan  Plan: Return again in 2 weeks.  Diagnosis: Major depressive disorder, single episode, moderate  Collaboration of Care: Primary Care Provider AEB patient works with PCP Dr. Sudie Bailey for medication management  Patient/Guardian was advised Release of Information must be obtained prior to any record release in order to collaborate their care with an outside provider. Patient/Guardian was advised if they have not already done so to contact  the registration department to sign all necessary  forms in order for [place] to release information regarding their care.   Consent: Patient/Guardian gives verbal consent for treatment and assignment of benefits for services provided during this visit. Patient/Guardian expressed understanding and agreed to proceed.   Adah Salvage, LCSW 04/28/2022

## 2022-05-12 ENCOUNTER — Telehealth (HOSPITAL_COMMUNITY): Payer: Self-pay | Admitting: Psychiatry

## 2022-05-12 ENCOUNTER — Ambulatory Visit (HOSPITAL_COMMUNITY): Payer: No Typology Code available for payment source | Admitting: Psychiatry

## 2022-05-12 NOTE — Telephone Encounter (Signed)
Therapist attempted to contact patient via phone regarding scheduled in office appointment and received voicemail recording.  Therapist left message indicating attempt and requesting patient call office. 

## 2022-05-24 IMAGING — MR MR LUMBAR SPINE W/O CM
4 of 5 series · 25 of 48 positions shown · non-contrast
Comparison: Lumbar spine radiographs 06/16/2017

CLINICAL DATA: Low back pain.  Buttock pain and right leg pain

EXAM:
MRI LUMBAR SPINE WITHOUT CONTRAST
TECHNIQUE: Multiplanar, multisequence MR imaging of the lumbar spine was
performed. No intravenous contrast was administered.

[Series 3: T2 · sagittal · 4.0mm · 0.53mm/px · 7 of 15 slices shown (1 of 2)]
[im 1/15]
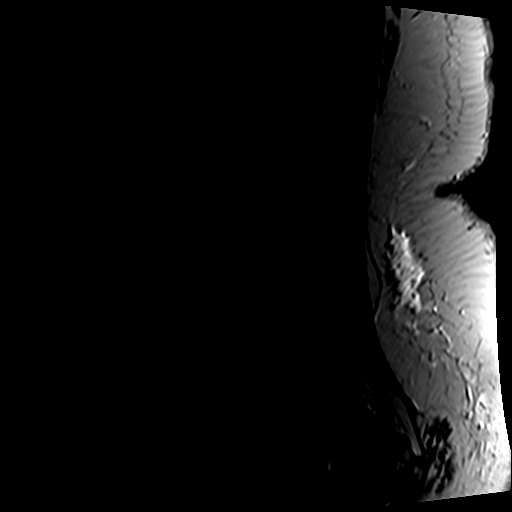
[im 3/15]
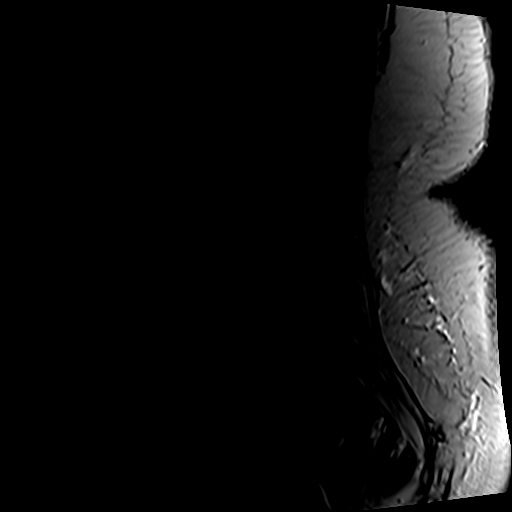
[im 5/15]
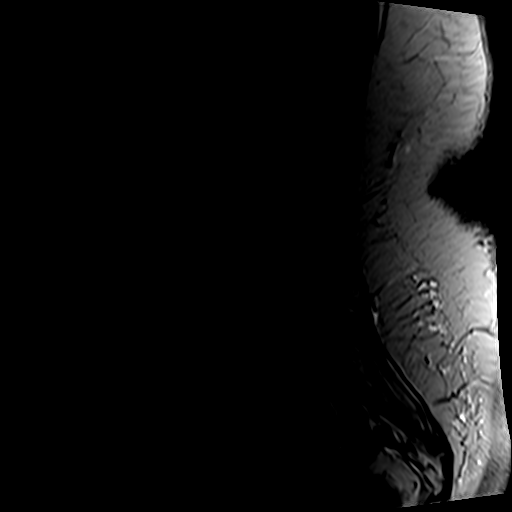
[im 8/15]
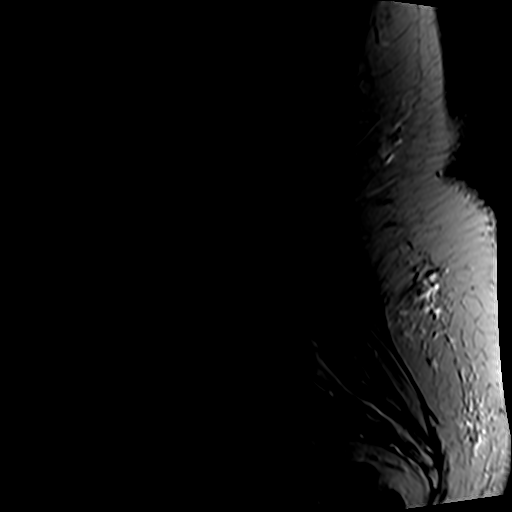
[im 10/15]
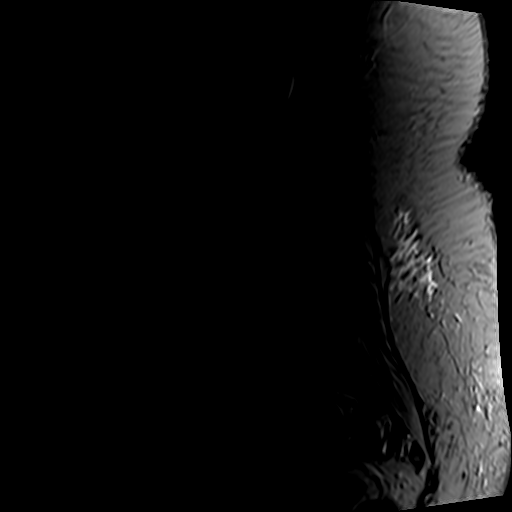
[im 12/15]
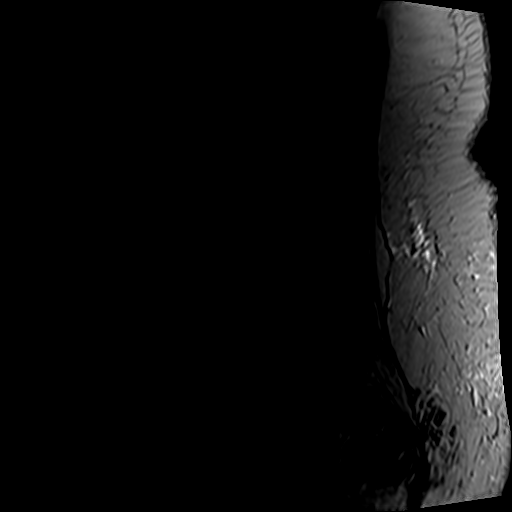
[im 15/15]
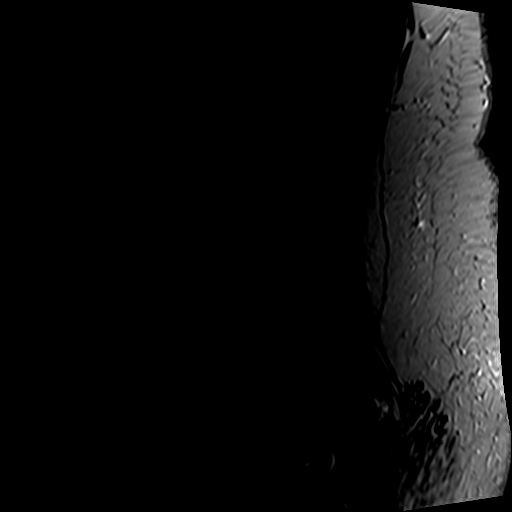

[Series 5: T1 · sagittal · 4.0mm · 0.53mm/px · 6 of 15 slices shown (1 of 2)]
[im 1/15]
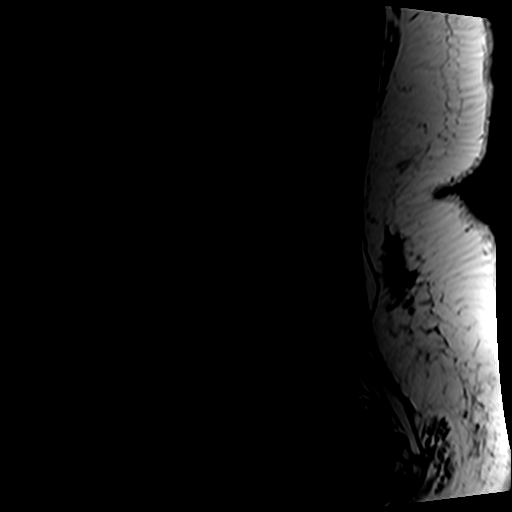
[im 3/15]
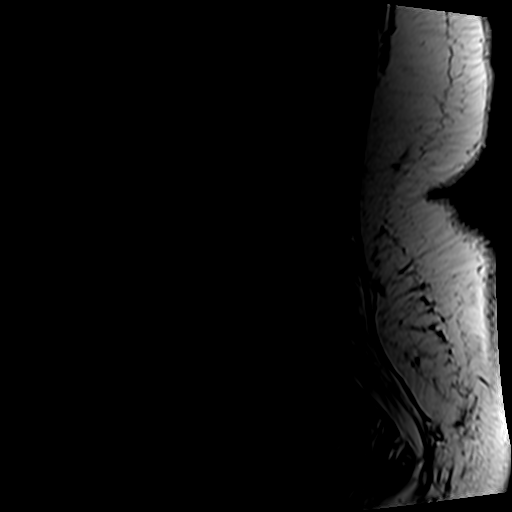
[im 6/15]
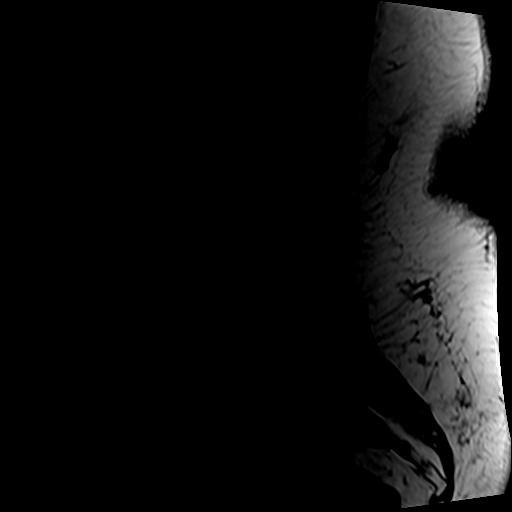
[im 9/15]
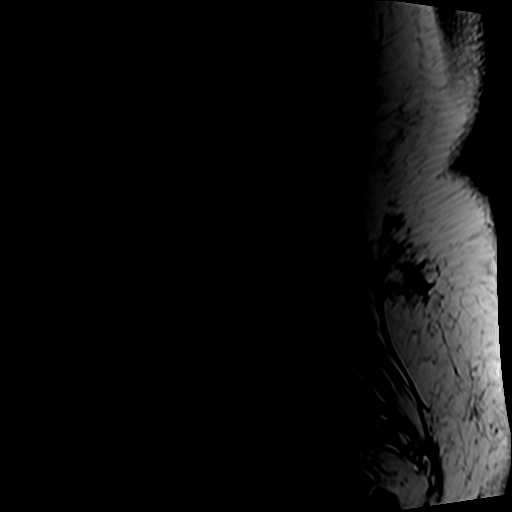
[im 12/15]
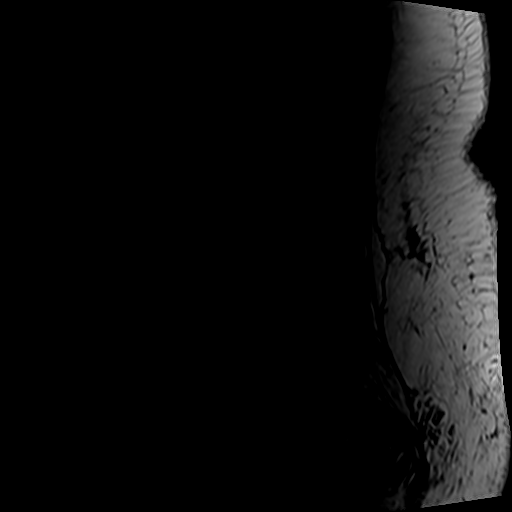
[im 15/15]
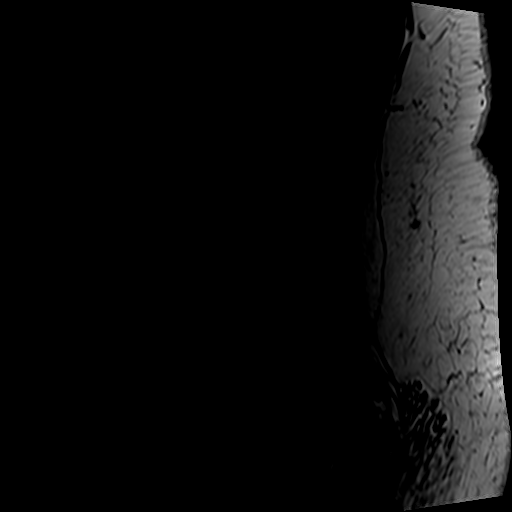

[Series 6: T2 · axial · 4.0mm · 0.70mm/px · z∈[-29,+164]mm · 8 of 34 slices shown (2 of 2)]
[im 1/34]
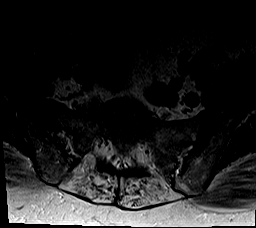
[im 6/34]
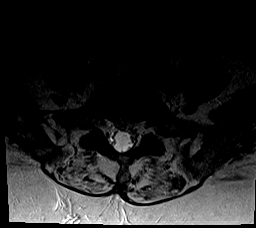
[im 11/34]
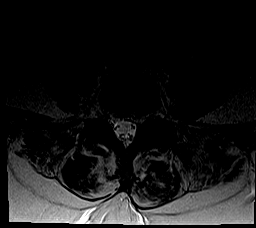
[im 16/34]
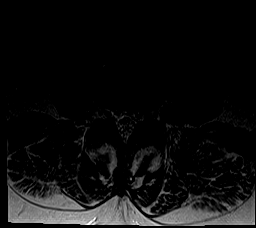
[im 18/34]
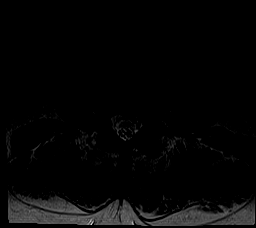
[im 23/34]
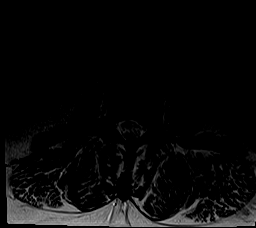
[im 28/34]
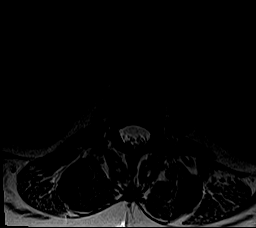
[im 34/34]
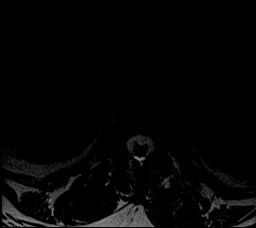

[Series 7: T1 · axial · 4.0mm · 0.35mm/px · z∈[-29,+133]mm · 4 of 34 slices shown (2 of 2)]
[im 1/34]
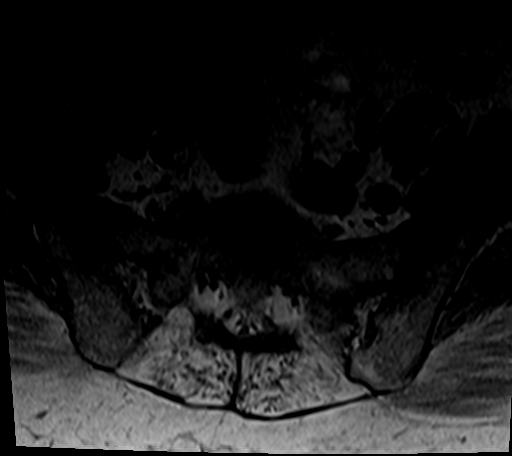
[im 6/34]
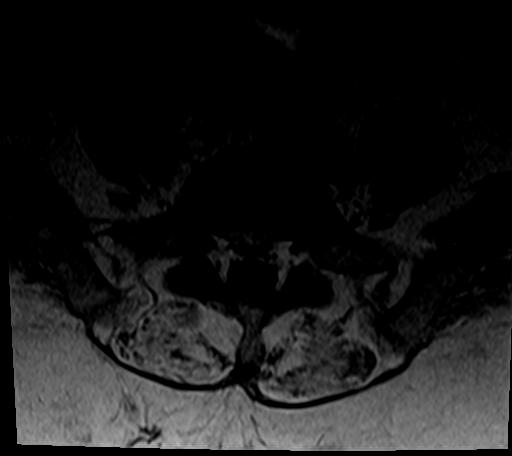
[im 18/34]
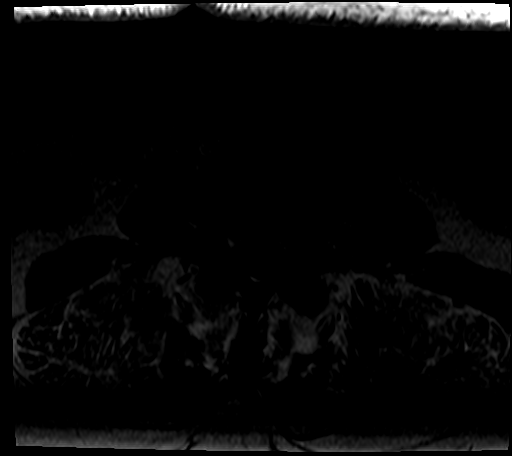
[im 28/34]
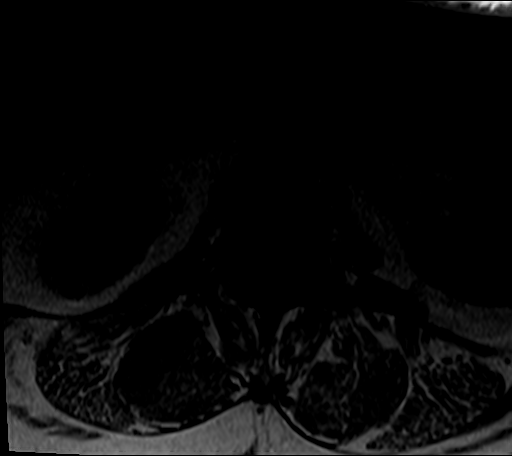

[25 of 48 positions shown; findings below may reference images not displayed]

FINDINGS: Segmentation:  5 lumbar vertebra

Alignment:  5 mm anterolisthesis L4-5.  Remaining alignment normal.

Vertebrae:  Negative for fracture or mass

Conus medullaris and cauda equina: Conus extends to the L1-2 level.
Conus and cauda equina appear normal.

Paraspinal and other soft tissues: Negative for paraspinous mass,
adenopathy, or fluid collection

Disc levels:

L1-2: Negative

L2-3: Bilateral facet degeneration. Negative for disc protrusion or
stenosis

L3-4: 5 mm anterolisthesis. Disc and facet degeneration. No disc
protrusion or stenosis

L4-5: Mild facet degeneration. Negative for spinal or foraminal
stenosis

L5-S1: Negative
IMPRESSION: Mild lumbar degenerative change. Negative for spinal stenosis or
neural impingement.

## 2022-06-02 ENCOUNTER — Ambulatory Visit (HOSPITAL_COMMUNITY): Payer: 59 | Admitting: Psychiatry

## 2022-06-14 ENCOUNTER — Telehealth: Payer: Self-pay | Admitting: Internal Medicine

## 2022-06-14 NOTE — Telephone Encounter (Signed)
Pt stated she'd like a note stating she can't be around cigarette smoke or vaping, where she lives is requesting this documentation. Pt is requesting a callback regarding this matter. Please advise

## 2022-06-14 NOTE — Telephone Encounter (Signed)
Spoke with pt who states that she is in a smoke free facility and a new resident is smoking next door. Pt states that while using her CPAP she can smell the smoke. She is requesting a letter from [facility] stating she not to be around cigarette or vape smoke. Please advise.

## 2022-06-15 ENCOUNTER — Encounter: Payer: Self-pay | Admitting: *Deleted

## 2022-06-15 NOTE — Telephone Encounter (Signed)
Patient informed and aware that letter will be up front for pick up.

## 2022-06-15 NOTE — Telephone Encounter (Signed)
Patient called back to check on the status of her letter request.

## 2022-06-16 ENCOUNTER — Ambulatory Visit (INDEPENDENT_AMBULATORY_CARE_PROVIDER_SITE_OTHER): Payer: Medicare HMO | Admitting: Psychiatry

## 2022-06-16 DIAGNOSIS — F321 Major depressive disorder, single episode, moderate: Secondary | ICD-10-CM | POA: Diagnosis not present

## 2022-06-16 NOTE — Progress Notes (Signed)
IN-PERSON  THERAPIST PROGRESS NOTE  Session Time: Thursday 06/16/2022 1:10 PM - 1:57 PM   Participation Level: Active  Behavioral Response: CasualAlertDepressed  Type of Therapy: Individual Therapy  Treatment Goals addressed:  Reduce frequency, intensity, and duration of depression symptoms so that daily functioning is improved AEB by reducing symptom frequency from 3 x per week to 1 x per week per self-report     Lorie will practice behavioral activation skills 3-4  times per week for the next 12 weeks weeks   ProgressTowards Goals: Progressing  Interventions: CBT and Supportive  Summary: Breanna [name] is a 67 y.o. female who is referred for services by PA from Dr. Emerson Monte office due to pt experiencing grief and loss issues. Per pt's report, she used to see Dr. Omelia Blackwater who previously dx'd pt with bipolar disorder and schizophrenia. PCP currently providing medication management for pt. She reports no involvement with any other behavioral health providers. She denies any psychiatric hospitalizations.  Patient reports being referred by PCP based on recommendations from her nutritionist.  Patient reports recently having a conversation with nutritionist about her grandmother who died many many years ago.  Patient also reports worries about her son who is incarcerated.  He is sick and she cannot help him per her report.  She also reports just worrying about all her children who are all adults.  Patient's current symptoms include sadness, difficulty concentrating, fatigue, hopelessness, irritability, tearfulness, worthlessness, sleep difficulty.   Patient last was seen about 2 months ago.  She reports decreased symptom frequency of anxiety and depression to about 2 times per week since last session.  Per her report, she has in proved sleeping patterns and now is going to bed by 1 AM.  As a result, she has been able to get up earlier during the day.  She reports going out on some days to go to the  thrift store or other places.  She also reports visiting her daughter more frequently and enjoying being with her grandchild.  Patient is pleased with her efforts and reports feeling better since she has become more active.  She reports experiencing some stress regarding her neighbor smoking as the smoke came through the vents.  This was particularly stressful for patient as patient has health issues.  However, patient used assertiveness skills and expressed her concerns to her apartment management.  She hopes that the issue has been resolved and reports she has not smelled cigarette smoke or vaping in the past week.   Suicidal/Homicidal: Negativewithout intent/plan       Therapist Response: Reviewed symptoms, using reinforced patient's efforts to improve sleep pattern/increase activity and increase socialization, assisted patient identify effects on mood/thoughts/behavior, assisted patient identify ways to improve consistency regarding behavioral activation, developed plan with patient to go walking using her walker 2 times per week, encouraged patient to follow through with her plans to purchase an elliptical to use inside her home, developed plan with patient to clean out the recliner in her bedroom and assisted patient identify ways to segment the task, also developed plan with patient to continue to set an earlier bedtime by half hour increments to assist her in her goal of going to bed early enough to go to church on Sundays, reviewed treatment, obtained patient's permission to electronically sign plan for patient, provided patient with copy   Plan: Return again in 2 weeks.  Diagnosis: Major depressive disorder, single episode, moderate (HCC)  Collaboration of Care: Primary Care Provider AEB patient works with  PCP Dr. Sudie Bailey for medication management  Patient/Guardian was advised Release of Information must be obtained prior to any record release in order to collaborate their care with an outside  provider. Patient/Guardian was advised if they have not already done so to contact the registration department to sign all necessary forms in order for [place] to release information regarding their care.   Consent: Patient/Guardian gives verbal consent for treatment and assignment of benefits for services provided during this visit. Patient/Guardian expressed understanding and agreed to proceed.   Adah Salvage, LCSW 06/16/2022

## 2022-06-23 ENCOUNTER — Ambulatory Visit: Payer: Medicare HMO | Attending: Internal Medicine | Admitting: Internal Medicine

## 2022-06-23 ENCOUNTER — Encounter: Payer: Self-pay | Admitting: Internal Medicine

## 2022-06-23 VITALS — BP 114/68 | HR 62 | Wt 270.8 lb

## 2022-06-23 DIAGNOSIS — I480 Paroxysmal atrial fibrillation: Secondary | ICD-10-CM | POA: Diagnosis not present

## 2022-06-23 NOTE — Progress Notes (Signed)
Cardiology Office Note  Date: 06/23/2022   ID: Breanna [name], DOB Jun 26, 1955, MRN [id number]  PCP:  Gareth Morgan, MD  Cardiologist:  Marjo Bicker, MD Electrophysiologist:  None   Reason for Office Visit: Follow-up of A-fib   History of Present Illness: Breanna [name] is a 67 y.o. female known to have paroxysmal A-fib on AC, HTN, HLD, OSA on CPAP presented to cardiology clinic for follow-up visit.  Patient was initially referred to cardiology clinic in 02/2022 for evaluation of palpitations.  Event monitor in 03/2022 showed 6% atrial fibrillation burden with the longest duration for 1 hour 48 minutes, HR range between 96 and 217 bpm, average HR 137 bpm. PAC burden is 13.2%, PVC burden is 2.2%. Patient symptomatic with A-fib, PAC and PVC.  She was started on rate controlling agents and systemic anticoagulation. She is here for follow-up visit.  Palpitations improved significantly, occurs once a week and does not last long. Probably for a few minutes.  No angina, DOE, dizziness, syncope and leg swelling.  Using CPAP for OSA.     Past Medical History:  Diagnosis Date   GERD (gastroesophageal reflux disease)    Hypercholesterolemia    Hypertension    Neuropathy    Osteoarthritis    Pain    Sleep apnea     Past Surgical History:  Procedure Laterality Date   ABDOMINAL HYSTERECTOMY     TUBAL LIGATION      Current Outpatient Medications  Medication Sig Dispense Refill   albuterol (VENTOLIN HFA) 108 (90 Base) MCG/ACT inhaler Inhale 2-3 puffs into the lungs 3 (three) times daily.     ALPRAZolam (XANAX) 1 MG tablet Take 1 mg by mouth at bedtime as needed for anxiety.     apixaban (ELIQUIS) 5 MG TABS tablet Take 1 tablet (5 mg total) by mouth 2 (two) times daily. 60 tablet 6   cetirizine (ZYRTEC) 10 MG tablet Take 10 mg by mouth daily.     diclofenac Sodium (VOLTAREN) 1 % GEL Apply 2 g topically in the morning, at noon, and at bedtime.     fluticasone (FLONASE) 50 MCG/ACT nasal  spray Place 2 sprays into both nostrils daily.     furosemide (LASIX) 40 MG tablet Take 40 mg by mouth as needed for fluid or edema.     gabapentin (NEURONTIN) 300 MG capsule Take 300 mg by mouth 3 (three) times daily.     hydrochlorothiazide (HYDRODIURIL) 25 MG tablet Take 25 mg by mouth as needed (for fluid).     HYDROcodone-acetaminophen (NORCO) 10-325 MG tablet Take 1 tablet by mouth every 6 (six) hours as needed.     LINZESS 145 MCG CAPS capsule Take 145 mcg by mouth as needed (constipation).     losartan (COZAAR) 25 MG tablet Take 25 mg by mouth daily.     lovastatin (ALTOPREV) 40 MG 24 hr tablet Take 40 mg by mouth at bedtime.     metoprolol tartrate (LOPRESSOR) 25 MG tablet Take 1 tablet (25 mg total) by mouth 2 (two) times daily. 180 tablet 3   pantoprazole (PROTONIX) 40 MG tablet Take 40 mg by mouth daily.     potassium chloride (MICRO-K) 10 MEQ CR capsule Take 20 mEq by mouth daily.     promethazine (PHENERGAN) 25 MG tablet Take 1 tablet (25 mg total) by mouth every 6 (six) hours as needed for nausea or vomiting. 30 tablet 0   QUEtiapine (SEROQUEL) 100 MG tablet Take 100 mg by mouth  at bedtime.     zolpidem (AMBIEN) 10 MG tablet Take 10 mg by mouth at bedtime as needed for sleep.     No current facility-administered medications for this visit.   Allergies:  Patient has no known allergies.   Social History: The patient  reports that she has never smoked. She does not have any smokeless tobacco history on file. She reports that she does not drink alcohol and does not use drugs.   Family History: The patient's family history includes Anxiety disorder in her mother; Depression in her mother; Schizophrenia in her father.   ROS:  Please see the history of present illness. Otherwise, complete review of systems is positive for none.  All other systems are reviewed and negative.   Physical Exam: VS:  BP 114/68   Pulse 62   Wt 270 lb 12.8 oz (122.8 kg)   SpO2 95%   BMI 49.53 kg/m ,  BMI Body mass index is 49.53 kg/m.  Wt Readings from Last 3 Encounters:  06/23/22 270 lb 12.8 oz (122.8 kg)  04/26/22 278 lb 12.8 oz (126.5 kg)  03/17/22 269 lb (122 kg)    General: Patient appears comfortable at rest. HEENT: Conjunctiva and lids normal, oropharynx clear with moist mucosa. Neck: Supple, no elevated JVP or carotid bruits, no thyromegaly. Lungs: Clear to auscultation, nonlabored breathing at rest. Cardiac: Regular rate and rhythm, no S3 or significant systolic murmur, no pericardial rub. Abdomen: Soft, nontender, no hepatomegaly, bowel sounds present, no guarding or rebound. Extremities: No pitting edema, distal pulses 2+. Skin: Warm and dry. Musculoskeletal: No kyphosis. Neuropsychiatric: Alert and oriented x3, affect grossly appropriate.  ECG:  An ECG dated 02/22/2022 and 03/17/2022 was personally reviewed today and demonstrated:  Normal sinus with with frequent PVCs  Recent Labwork: No results found for requested labs within last 365 days.  No results found for: "CHOL", "TRIG", "HDL", "CHOLHDL", "VLDL", "LDLCALC", "LDLDIRECT"  Other Studies Reviewed Today: I personally reviewed the heart care referral records from the PCP  Assessment and Plan: Patient is a 67 year old F known to have new onset paroxysmal atrial fibrillation (diagnosed on event monitor from 03/2022), 13.2% PAC burden, 2.2% PVC burden, HTN, HLD, OSA not on CPAP is here for follow-up visit.  # Paroxysmal A-fib # 13.2% PAC burden # 2.2% PVC burden -EKG today showed NSR, rate controlled -Continue metoprolol tartrate 25 mg twice daily -Continue Eliquis 5 mg twice daily -Continue CPAP for OSA management  # OSA on CPAP -Continue CPAP therapy.  # HTN, controlled -Continue losartan 25 mg once daily  # HLD -Continue lovastatin 40 mg nightly, goal LDL less than 100.  I have spent a total of 30 minutes with patient reviewing chart, EKGs, labs and examining patient as well as establishing an assessment  and plan that was discussed with the patient.  > 50% of time was spent in direct patient care.      Medication Adjustments/Labs and Tests Ordered: Current medicines are reviewed at length with the patient today.  Concerns regarding medicines are outlined above.   Tests Ordered: Orders Placed This Encounter  Procedures   EKG 12-Lead    Medication Changes: No orders of the defined types were placed in this encounter.   Disposition:  Follow up  6 months  Signed Adelis Docter Verne Spurr, MD, 06/23/2022 3:09 PM    [facility] at [facility] [address] [place], [place], [place] [address]

## 2022-06-23 NOTE — Patient Instructions (Signed)
Medication Instructions:  Your physician recommends that you continue on your current medications as directed. Please refer to the Current Medication list given to you today.   Labwork: None  Testing/Procedures: none  Follow-Up:  Your physician recommends that you schedule a follow-up appointment in: 6 months  Any Other Special Instructions Will Be Listed Below (If Applicable).  If you need a refill on your cardiac medications before your next appointment, please call your pharmacy.  

## 2022-07-21 ENCOUNTER — Ambulatory Visit (HOSPITAL_COMMUNITY): Payer: Medicare HMO | Admitting: Psychiatry

## 2022-07-21 ENCOUNTER — Telehealth (HOSPITAL_COMMUNITY): Payer: Self-pay | Admitting: Psychiatry

## 2022-07-21 NOTE — Telephone Encounter (Signed)
Therapist attempted to contact patient via phone regarding scheduled in office appointment and received voicemail message.  Therapist left message indicating attempt and requesting patient call office. 

## 2022-07-25 ENCOUNTER — Ambulatory Visit (INDEPENDENT_AMBULATORY_CARE_PROVIDER_SITE_OTHER): Payer: No Typology Code available for payment source | Admitting: Psychiatry

## 2022-07-25 DIAGNOSIS — F321 Major depressive disorder, single episode, moderate: Secondary | ICD-10-CM | POA: Diagnosis not present

## 2022-07-25 NOTE — Progress Notes (Unsigned)
IN-PERSON  THERAPIST PROGRESS NOTE  Session Time: Monday 07/25/2022 4:16 PM  Participation Level: Active  Behavioral Response: CasualAlertDepressed  Type of Therapy: Individual Therapy  Treatment Goals addressed:  Reduce frequency, intensity, and duration of depression symptoms so that daily functioning is improved AEB by reducing symptom frequency from 3 x per week to 1 x per week per self-report     Biddie will practice behavioral activation skills 3-4  times per week for the next 12 weeks weeks   ProgressTowards Goals: Progressing  Interventions: CBT and Supportive  Summary: Breanna [name] is a 67 y.o. female who is referred for services by PA from Dr. Emerson Monte office due to pt experiencing grief and loss issues. Per pt's report, she used to see Dr. Omelia Blackwater who previously dx'd pt with bipolar disorder and schizophrenia. PCP currently providing medication management for pt. She reports no involvement with any other behavioral health providers. She denies any psychiatric hospitalizations.  Patient reports being referred by PCP based on recommendations from her nutritionist.  Patient reports recently having a conversation with nutritionist about her grandmother who died many many years ago.  Patient also reports worries about her son who is incarcerated.  He is sick and she cannot help him per her report.  She also reports just worrying about all her children who are all adults.  Patient's current symptoms include sadness, difficulty concentrating, fatigue, hopelessness, irritability, tearfulness, worthlessness, sleep difficulty.   Patient last was seen about 5-6 weeks ago.  She reports decreased symptom frequency of anxiety and depression to about 2 times per week since last session.  Per her report, she has in proved sleeping patterns and now is going to bed by 1 AM.  As a result, she has been able to get up earlier during the day.  She reports going out on some days to go to the thrift store or  other places.  She also reports visiting her daughter more frequently and enjoying being with her grandchild.  Patient is pleased with her efforts and reports feeling better since she has become more active.  She reports experiencing some stress regarding her neighbor smoking as the smoke came through the vents.  This was particularly stressful for patient as patient has health issues.  However, patient used assertiveness skills and expressed her concerns to her apartment management.  She hopes that the issue has been resolved and reports she has not smelled cigarette smoke or vaping in the past week.   Suicidal/Homicidal: Negativewithout intent/plan       Therapist Response: Reviewed symptoms, using reinforced patient's efforts to improve sleep pattern/increase activity and increase socialization, assisted patient identify effects on mood/thoughts/behavior, assisted patient identify ways to improve consistency regarding behavioral activation, developed plan with patient to go walking using her walker 2 times per week, encouraged patient to follow through with her plans to purchase an elliptical to use inside her home, developed plan with patient to clean out the recliner in her bedroom and assisted patient identify ways to segment the task, also developed plan with patient to continue to set an earlier bedtime by half hour increments to assist her in her goal of going to bed early enough to go to church on Sundays, reviewed treatment, obtained patient's permission to electronically sign plan for patient, provided patient with copy   Plan: Return again in 2 weeks.  Diagnosis: Major depressive disorder, single episode, moderate (HCC)  Collaboration of Care: Primary Care Provider AEB patient works with PCP Dr. Sudie Bailey for  medication management  Patient/Guardian was advised Release of Information must be obtained prior to any record release in order to collaborate their care with an outside provider.  Patient/Guardian was advised if they have not already done so to contact the registration department to sign all necessary forms in order for [place] to release information regarding their care.   Consent: Patient/Guardian gives verbal consent for treatment and assignment of benefits for services provided during this visit. Patient/Guardian expressed understanding and agreed to proceed.   Adah Salvage, LCSW 07/25/2022

## 2022-08-04 ENCOUNTER — Ambulatory Visit (INDEPENDENT_AMBULATORY_CARE_PROVIDER_SITE_OTHER): Payer: Medicare HMO | Admitting: Psychiatry

## 2022-08-04 DIAGNOSIS — F321 Major depressive disorder, single episode, moderate: Secondary | ICD-10-CM | POA: Diagnosis not present

## 2022-08-04 NOTE — Progress Notes (Signed)
IN-PERSON  THERAPIST PROGRESS NOTE  Session Time: Tuesday 08/04/2022 3:14 PM -   Participation Level: Active  Behavioral Response: CasualAlert/less depressed  Type of Therapy: Individual Therapy  Treatment Goals addressed:  Reduce frequency, intensity, and duration of depression symptoms so that daily functioning is improved AEB by reducing symptom frequency from 3 x per week to 1 x per week per self-report     Jenesys will practice behavioral activation skills 3-4  times per week for the next 12 weeks weeks   ProgressTowards Goals: Progressing  Interventions: CBT and Supportive  Summary: ABBEGAYLE DENAULT is a 67 y.o. female who is referred for services by PA from Dr. Emerson Monte office due to pt experiencing grief and loss issues. Per pt's report, she used to see Dr. Omelia Blackwater who previously dx'd pt with bipolar disorder and schizophrenia. PCP currently providing medication management for pt. She reports no involvement with any other behavioral health providers. She denies any psychiatric hospitalizations.  Patient reports being referred by PCP based on recommendations from her nutritionist.  Patient reports recently having a conversation with nutritionist about her grandmother who died many many years ago.  Patient also reports worries about her son who is incarcerated.  He is sick and she cannot help him per her report.  She also reports just worrying about all her children who are all adults.  Patient's current symptoms include sadness, difficulty concentrating, fatigue, hopelessness, irritability, tearfulness, worthlessness, sleep difficulty.   Patient last was seen about 1-2  weeks ago.  She reports her mood has been stable since last session.  She has experienced a few down moments and identified triggers being her deceased grandmother's birthday and seeing information on social media about a police shooting of a young mother.  Patient reports not being overwhelmed but feeling a little down.  She  has maintained consistent behavioral activation in visiting her daughter and helping take care of her grandson child.  She also is pleased she went to a recent church event for 7 years and reports enjoying socializing very much.  These events are monthly and patient plans to continue attending.  Patient also expresses desire to increase behavior activation more frequently and doing other things besides visiting her daughter.  Patient also has made the decision not to ask her granddaughter to be a personal care aide.  She expresses relief since making this decision.    Suicidal/Homicidal: Negativewithout intent/plan       Therapist Response: Reviewed symptoms, assisted patient identify triggers of sadness, validated feelings and normalized feelings related to grief and loss, discussed integrated grief, praised and reinforced patient's continued behavioral activation regarding visiting her daughter, assisted patient began to identify ways to increase behavioral activation, developed plan with patient to start going to [employer] about once per week, praised and reinforced patient's increased socialization by attending social event, assisted patient identify the effects on her mood/thoughts/and behavior, encouraged patient to follow through with her plans to continue involvement, also began to assist assist patient identify other possible avenues for increased socialization including possibly attending a class related to patient's interest, patient plans to do research regarding this   Plan: Return again in 2 weeks.  Diagnosis: Major depressive disorder, single episode, moderate (HCC)  Collaboration of Care: Primary Care Provider AEB patient works with PCP Dr. Sudie Bailey for medication management  Patient/Guardian was advised Release of Information must be obtained prior to any record release in order to collaborate their care with an outside provider. Patient/Guardian was advised if  they have not already  done so to contact the registration department to sign all necessary forms in order for [place] to release information regarding their care.   Consent: Patient/Guardian gives verbal consent for treatment and assignment of benefits for services provided during this visit. Patient/Guardian expressed understanding and agreed to proceed.   Adah Salvage, LCSW 08/04/2022

## 2022-08-18 ENCOUNTER — Ambulatory Visit (INDEPENDENT_AMBULATORY_CARE_PROVIDER_SITE_OTHER): Payer: No Typology Code available for payment source | Admitting: Psychiatry

## 2022-08-18 DIAGNOSIS — F321 Major depressive disorder, single episode, moderate: Secondary | ICD-10-CM

## 2022-08-18 NOTE — Progress Notes (Signed)
Virtual Visit via Telephone Note  I connected with Breanna [name] on 08/18/22 at 1:10 PM EDT  by telephone and verified that I am speaking with the correct person using two identifiers.  Location: Patient: Home Provider: [facility] [place] office    I discussed the limitations, risks, security and privacy concerns of performing an evaluation and management service by telephone and the availability of in person appointments. I also discussed with the patient that there may be a patient responsible charge related to this service. The patient expressed understanding and agreed to proceed.    I provided 35 minutes of non-face-to-face time during this encounter.   Adah Salvage, LCSW  IN-PERSON  THERAPIST PROGRESS NOTE  Session Time: Thursday 08/18/2022  1:11 PM - 1:46 PM  Participation Level: Active  Behavioral Response: CasualAlert/less depressed  Type of Therapy: Individual Therapy  Treatment Goals addressed:  Reduce frequency, intensity, and duration of depression symptoms so that daily functioning is improved AEB by reducing symptom frequency from 3 x per week to 1 x per week per self-report     Breanna [name] will practice behavioral activation skills 3-4  times per week for the next 12 weeks weeks   ProgressTowards Goals: Progressing  Interventions: CBT and Supportive  Summary: Breanna [name] is a 67 y.o. female who is referred for services by PA from Dr. Emerson Monte office due to pt experiencing grief and loss issues. Per pt's report, she used to see Dr. Omelia Blackwater who previously dx'd pt with bipolar disorder and schizophrenia. PCP currently providing medication management for pt. She reports no involvement with any other behavioral health providers. She denies any psychiatric hospitalizations.  Patient reports being referred by PCP based on recommendations from her nutritionist.  Patient reports recently having a conversation with nutritionist about her grandmother who died many many years  ago.  Patient also reports worries about her son who is incarcerated.  He is sick and she cannot help him per her report.  She also reports just worrying about all her children who are all adults.  Patient's current symptoms include sadness, difficulty concentrating, fatigue, hopelessness, irritability, tearfulness, worthlessness, sleep difficulty.   Patient last was seen about 2 weeks ago.  She reports increased stress and anxiety due to her 29 year old daughter recently having a mental health crisis. Patient reports going to [facility] earlier this week to pick her up from the psychiatric facility. She states she has felt a little down. She is glad daughter is home but still worries about daughter. She reports calling her daughter daily to check on her but also is thankful her daughter's adult children also reside in the home and take care of their mother. Pt  reports increased fatigue and worry. She has not gone to [employer] yet but did research classes at [employer]./ She is considering attending a medical office class. She is looking forward to attending church this Sunday and plans to attend a church social event on August 18. Therapist and pt agreed to end session early as pt does not feel well.    Suicidal/Homicidal: Negativewithout intent/plan       Therapist Response: Reviewed symptoms, discussed stressors, facilitated pt expressing thoughts and feelings, validated feelings, discussed rationale for and assisted pt practice a beach visualization to reduce stress and anxiety, developed plan with pt to practice, she had gotten interactive audio activity to patient and provided with access code to assist patient in her efforts, praised and reinforced patient's continued efforts to identify ways  to increase behavioral activation/socialization, encouraged patient to follow through with her plans to attend church events, also encouraged patient to follow through with plans to attend in [employer] and find out  more information about class, ended session early as patient was not feeling well Plan: Return again in 2 weeks.  Diagnosis: Major depressive disorder, single episode, moderate (HCC)  Collaboration of Care: Primary Care Provider AEB patient works with PCP Dr. Sudie Bailey for medication management  Patient/Guardian was advised Release of Information must be obtained prior to any record release in order to collaborate their care with an outside provider. Patient/Guardian was advised if they have not already done so to contact the registration department to sign all necessary forms in order for [place] to release information regarding their care.   Consent: Patient/Guardian gives verbal consent for treatment and assignment of benefits for services provided during this visit. Patient/Guardian expressed understanding and agreed to proceed.   Adah Salvage, LCSW 08/18/2022

## 2022-08-22 ENCOUNTER — Ambulatory Visit: Payer: 59 | Admitting: Nutrition

## 2022-08-29 ENCOUNTER — Telehealth: Payer: Self-pay | Admitting: Internal Medicine

## 2022-08-29 DIAGNOSIS — I480 Paroxysmal atrial fibrillation: Secondary | ICD-10-CM

## 2022-08-29 MED ORDER — APIXABAN 5 MG PO TABS: 5.0000 mg | ORAL_TABLET | Freq: Two times a day (BID) | ORAL | 1 refills | Status: DC

## 2022-08-29 NOTE — Telephone Encounter (Signed)
Prescription refill request for Eliquis received. Indication: a Fib Last office visit: 06/23/22 Scr: 0.83 01/18/22 epic Age: 67 Weight: 122kg

## 2022-08-29 NOTE — Telephone Encounter (Signed)
*  STAT* If patient is at the pharmacy, call can be transferred to refill team.   1. Which medications need to be refilled? (please list name of each medication and dose if known) apixaban (ELIQUIS) 5 MG TABS tablet  2. Which pharmacy/location (including street and city if local pharmacy) is medication to be sent to? [id number] - [place], [place] - [address]   3. Do they need a 30 day or 90 day supply?  90 day supply

## 2022-09-15 ENCOUNTER — Ambulatory Visit: Payer: 59 | Admitting: Internal Medicine

## 2022-09-20 ENCOUNTER — Ambulatory Visit (HOSPITAL_COMMUNITY): Payer: Medicare HMO | Admitting: Psychiatry

## 2022-09-22 ENCOUNTER — Other Ambulatory Visit (HOSPITAL_COMMUNITY): Payer: Self-pay | Admitting: Nurse Practitioner

## 2022-09-22 DIAGNOSIS — Z1231 Encounter for screening mammogram for malignant neoplasm of breast: Secondary | ICD-10-CM

## 2022-09-29 ENCOUNTER — Ambulatory Visit (HOSPITAL_COMMUNITY): Payer: Medicare HMO

## 2022-10-04 ENCOUNTER — Ambulatory Visit (INDEPENDENT_AMBULATORY_CARE_PROVIDER_SITE_OTHER): Payer: No Typology Code available for payment source | Admitting: Psychiatry

## 2022-10-04 DIAGNOSIS — F321 Major depressive disorder, single episode, moderate: Secondary | ICD-10-CM | POA: Diagnosis not present

## 2022-10-04 NOTE — Progress Notes (Signed)
Virtual Visit via Telephone Note  I connected with Breanna [name] on 10/04/22 at  2:00 PM EDT by telephone and verified that I am speaking with the correct person using two identifiers.  Location: Patient: Home Provider: [facility] [place] office    I discussed the limitations, risks, security and privacy concerns of performing an evaluation and management service by telephone and the availability of in person appointments. I also discussed with the patient that there may be a patient responsible charge related to this service. The patient expressed understanding and agreed to proceed.   I provided 50 minutes of non-face-to-face time during this encounter.   Breanna Salvage, LCSW   IN-PERSON  THERAPIST PROGRESS NOTE  Session Time: Tuesday 10/04/2022  2:00 PM - 2:50 PM   Participation Level: Active  Behavioral Response: CasualAlert/less depressed  Type of Therapy: Individual Therapy  Treatment Goals addressed:  Reduce frequency, intensity, and duration of depression symptoms so that daily functioning is improved AEB by reducing symptom frequency from 3 x per week to 1 x per week per self-report     Neeta will practice behavioral activation skills 3-4  times per week for the next 12 weeks weeks   ProgressTowards Goals: Progressing  Interventions: CBT and Supportive  Summary: Breanna [name] is a 67 y.o. female who is referred for services by PA from Dr. Emerson Monte office due to pt experiencing grief and loss issues. Per pt's report, she used to see Dr. Omelia [name] who previously dx'd pt with bipolar disorder and schizophrenia. PCP currently providing medication management for pt. She reports no involvement with any other behavioral health providers. She denies any psychiatric hospitalizations.  Patient reports being referred by PCP based on recommendations from her nutritionist.  Patient reports recently having a conversation with nutritionist about her grandmother who died many many years  ago.  Patient also reports worries about her son who is incarcerated.  He is sick and she cannot help him per her report.  She also reports just worrying about all her children who are all adults.  Patient's current symptoms include sadness, difficulty concentrating, fatigue, hopelessness, irritability, tearfulness, worthlessness, sleep difficulty.   Patient last was seen about 2 months ago.  She reports increased stress and anxiety along with symptoms of depression. She reports ruminating thoughts, social withdrawal,  and decreased interest in activities for the past several days. Per her report, she did not leave her home this past weekend. Stressors include worry about her children and grandchildren. Her 21 yo granddaughter attempted suicide and now is hospitalized at [facility] in [place]. Another granddaughter, age 30, experienced suicidal ideations and stated she pointed a gun to her head, pulled the trigger, but gun did not work, She  posted her experience on Face Book. Pt states granddaughter is better now. Her oldest daughter had another mental health crisis today due to medication noncompliance but was seen at ED and was prescribed medication. She reports additional stress related to conflict with her youngest daughter regarding childcare. Pt reports she has used assertiveness skills to set and maintain limits with daughter who now is not speaking to patient. Pt reports she did not implement plans developed last session as she had been busy trying to help daughter with childcare.      Suicidal/Homicidal: Negativewithout intent/plan       Therapist Response: Reviewed symptoms, discussed stressors, facilitated pt expressing thoughts and feelings, validated feelings, praised and reinforced pt's use of assertiveness skills, discussed effects, also assisted patient identify personal  rights to promote effective assertion, assisted patient identify realistic expectations of self, discussed rationale for and  discussed use of a mindfulness activity to cope with ruminating thoughts, will send pt via mail access code to interactive audio activity, developed plan with pt to practice activity to cope with ruminating thoughts  Plan: Return again in 2 weeks.  Diagnosis: Major depressive disorder, single episode, moderate (HCC)  Collaboration of Care: Primary Care Provider AEB patient works with PCP Dr. Sudie Bailey for medication management  Patient/Guardian was advised Release of Information must be obtained prior to any record release in order to collaborate their care with an outside provider. Patient/Guardian was advised if they have not already done so to contact the registration department to sign all necessary forms in order for Breanna [name] to release information regarding their care.   Consent: Patient/Guardian gives verbal consent for treatment and assignment of benefits for services provided during this visit. Patient/Guardian expressed understanding and agreed to proceed.   Breanna Salvage, LCSW 10/04/2022

## 2022-10-05 ENCOUNTER — Ambulatory Visit: Payer: Medicare HMO | Admitting: Family Medicine

## 2022-10-18 ENCOUNTER — Ambulatory Visit (INDEPENDENT_AMBULATORY_CARE_PROVIDER_SITE_OTHER): Payer: Medicare HMO | Admitting: Psychiatry

## 2022-10-18 DIAGNOSIS — F321 Major depressive disorder, single episode, moderate: Secondary | ICD-10-CM

## 2022-10-18 NOTE — Progress Notes (Signed)
IN-PERSON  THERAPIST PROGRESS NOTE  Session Time: Tuesday 10/18/2022  2:05 PM  - 2:52 PM   Participation Level: Active  Behavioral Response: CasualAlert/less depressed  Type of Therapy: Individual Therapy  Treatment Goals addressed:  Reduce frequency, intensity, and duration of depression symptoms so that daily functioning is improved AEB by reducing symptom frequency from 3 x per week to 1 x per week per self-report     Torianne will practice behavioral activation skills 3-4  times per week for the next 12 weeks weeks   ProgressTowards Goals: Progressing  Interventions: CBT and Supportive  Summary: NANIE DUNKLEBERGER is a 67 y.o. female who is referred for services by PA from Dr. Emerson Monte office due to pt experiencing grief and loss issues. Per pt's report, she used to see Dr. Omelia Blackwater who previously dx'd pt with bipolar disorder and schizophrenia. PCP currently providing medication management for pt. She reports no involvement with any other behavioral health providers. She denies any psychiatric hospitalizations.  Patient reports being referred by PCP based on recommendations from her nutritionist.  Patient reports recently having a conversation with nutritionist about her grandmother who died many many years ago.  Patient also reports worries about her son who is incarcerated.  He is sick and she cannot help him per her report.  She also reports just worrying about all her children who are all adults.  Patient's current symptoms include sadness, difficulty concentrating, fatigue, hopelessness, irritability, tearfulness, worthlessness, sleep difficulty.   Patient last was seen about 2 weeks  ago.  She reports continued stress  regarding her family since last session. She is not as worried about family but has been very busy providing transportation for her daughter and 42 yo granddaughter to attend doctors appointments and get medication.  She also has been checking on her 70 year old granddaughter  daily the text/FaceTime.  She reports additional stress related to recent conversation with her youngest daughter regarding their relationship.  Patient expresses sadness, frustration, and disappointment as her daughter described their relationship as transactional rather than in the way patient feels the relationship.  Patient did use assertiveness skills to express her concerns and her feelings.  She also reports some conflict with her other daughter but again setting and maintaining limits.  Patient expresses frustration as children do not seem to realize she is getting older and is unable to do things like she has done in the past per patient's report.  Patient reports sleep difficulty as she states having racing thoughts at night and feeling more depressed at that time.  She denies any suicidal ideations.  Patient will follow-up with PCP regarding medication.   Suicidal/Homicidal: Negativewithout intent/plan       Therapist Response: Reviewed symptoms, discussed stressors, facilitated pt expressing thoughts and feelings, validated feelings, praised and reinforced pt's use of assertiveness skills, discussed effects, assisted patient identify realistic expectations regarding interaction with her children encouraged patient to follow through contact PCP regarding medication, reviewed rationale for and discussed use of a mindfulness activity (leaves on a stream) to cope with ruminating thoughts, checked out interactive audio activity to patient and provided with access code to assist patient in her efforts    Plan: Return again in 2 weeks.  Diagnosis: Major depressive disorder, single episode, moderate (HCC)  Collaboration of Care: Primary Care Provider AEB patient works with PCP Dr. Sudie Bailey for medication management  Patient/Guardian was advised Release of Information must be obtained prior to any record release in order to collaborate their care with an  outside provider. Patient/Guardian was advised  if they have not already done so to contact the registration department to sign all necessary forms in order for [place] to release information regarding their care.   Consent: Patient/Guardian gives verbal consent for treatment and assignment of benefits for services provided during this visit. Patient/Guardian expressed understanding and agreed to proceed.   Adah Salvage, LCSW 10/18/2022

## 2022-10-19 ENCOUNTER — Ambulatory Visit (HOSPITAL_COMMUNITY)
Admission: RE | Admit: 2022-10-19 | Discharge: 2022-10-19 | Disposition: A | Discharge status: Home / Self Care | Payer: Medicare HMO | Source: Ambulatory Visit | Attending: Nurse Practitioner | Admitting: Nurse Practitioner

## 2022-10-19 DIAGNOSIS — Z1231 Encounter for screening mammogram for malignant neoplasm of breast: Secondary | ICD-10-CM | POA: Diagnosis present

## 2022-12-19 ENCOUNTER — Ambulatory Visit (INDEPENDENT_AMBULATORY_CARE_PROVIDER_SITE_OTHER): Payer: Medicare HMO | Admitting: Psychiatry

## 2022-12-19 DIAGNOSIS — F321 Major depressive disorder, single episode, moderate: Secondary | ICD-10-CM | POA: Diagnosis not present
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
## 2022-12-27 ENCOUNTER — Ambulatory Visit: Payer: Medicare HMO | Attending: Internal Medicine | Admitting: Internal Medicine

## 2022-12-27 ENCOUNTER — Encounter: Payer: Self-pay | Admitting: Internal Medicine

## 2022-12-27 VITALS — BP 118/78 | HR 78 | Ht 62.25 in | Wt 255.2 lb

## 2022-12-27 DIAGNOSIS — I480 Paroxysmal atrial fibrillation: Secondary | ICD-10-CM | POA: Diagnosis not present

## 2022-12-27 DIAGNOSIS — G4733 Obstructive sleep apnea (adult) (pediatric): Secondary | ICD-10-CM | POA: Diagnosis not present

## 2022-12-27 NOTE — Patient Instructions (Addendum)
 Medication Instructions:  Your physician recommends that you continue on your current medications as directed. Please refer to the Current Medication list given to you today.   Labwork: None  Testing/Procedures: None  Follow-Up: Your physician recommends that you schedule a follow-up appointment in: 1 year. You will receive a reminder call in about 8 months reminding you to schedule your appointment. If you don't receive this call, please contact our office.   Any Other Special Instructions Will Be Listed Below (If Applicable). Referral to Sleep Medicine  Thank you for choosing [facility]!      If you need a refill on your cardiac medications before your next appointment, please call your pharmacy.

## 2022-12-27 NOTE — Progress Notes (Signed)
Cardiology Office Note  Date: 12/27/2022   ID: [name], Breanna Apr 10, 1955, MRN [id number]  PCP:  Barbette Merino, NP  Cardiologist:  Marjo Bicker, Breanna [name] Electrophysiologist:  None     History of Present Illness: Breanna [name] is a 67 y.o. female known to have paroxysmal A-fib on AC, HTN, HLD, OSA on CPAP presented to cardiology clinic for follow-up visit.  Patient was initially referred to cardiology clinic in 02/2022 for evaluation of palpitations.  Event monitor in 03/2022 showed 6% atrial fibrillation burden with the longest duration for 1 hour 48 minutes, HR range between 96 and 217 bpm, average HR 137 bpm. PAC burden is 13.2%, PVC burden is 2.2%. Patient symptomatic with A-fib, PAC and PVC.  She was started on rate controlling agents and systemic anticoagulation. She is here for follow-up visit.  Occasional palpitations.  No other symptoms of angina, DOE, dizziness, syncope and leg swelling.  Using CPAP for OSA but the inside rubber lining of the CPAP machine is coming off and she wanted to be seen by a sleep medicine doctor.     Past Medical History:  Diagnosis Date   GERD (gastroesophageal reflux disease)    Hypercholesterolemia    Hypertension    Neuropathy    Osteoarthritis    Pain    Sleep apnea     Past Surgical History:  Procedure Laterality Date   ABDOMINAL HYSTERECTOMY     TUBAL LIGATION      Current Outpatient Medications  Medication Sig Dispense Refill   albuterol (VENTOLIN HFA) 108 (90 Base) MCG/ACT inhaler Inhale 2-3 puffs into the lungs 3 (three) times daily.     ALPRAZolam (XANAX) 1 MG tablet Take 1 mg by mouth at bedtime as needed for anxiety.     apixaban (ELIQUIS) 5 MG TABS tablet Take 1 tablet (5 mg total) by mouth 2 (two) times daily. 180 tablet 1   cetirizine (ZYRTEC) 10 MG tablet Take 10 mg by mouth daily.     diclofenac Sodium (VOLTAREN) 1 % GEL Apply 2 g topically in the morning, at noon, and at bedtime.     docusate sodium (COLACE) 100 MG  capsule Take 100 mg by mouth 2 (two) times daily.     fluticasone (FLONASE) 50 MCG/ACT nasal spray Place 2 sprays into both nostrils daily.     furosemide (LASIX) 40 MG tablet Take 40 mg by mouth as needed for fluid or edema.     gabapentin (NEURONTIN) 300 MG capsule Take 300 mg by mouth 3 (three) times daily.     hydrochlorothiazide (HYDRODIURIL) 25 MG tablet Take 25 mg by mouth as needed (for fluid).     HYDROcodone-acetaminophen (NORCO) 10-325 MG tablet Take 1 tablet by mouth every 6 (six) hours as needed.     levothyroxine (SYNTHROID) 50 MCG tablet Take 50 mcg by mouth every morning.     LINZESS 145 MCG CAPS capsule Take 145 mcg by mouth as needed (constipation).     losartan (COZAAR) 25 MG tablet Take 25 mg by mouth daily.     lovastatin (ALTOPREV) 40 MG 24 hr tablet Take 40 mg by mouth at bedtime.     metoprolol tartrate (LOPRESSOR) 25 MG tablet Take 1 tablet (25 mg total) by mouth 2 (two) times daily. 180 tablet 3   montelukast (SINGULAIR) 10 MG tablet Take 10 mg by mouth daily.     MOUNJARO 15 MG/0.5ML Pen Inject 15 mg into the skin once a week.  pantoprazole (PROTONIX) 40 MG tablet Take 40 mg by mouth daily.     potassium chloride (MICRO-K) 10 MEQ CR capsule Take 20 mEq by mouth daily.     promethazine (PHENERGAN) 25 MG tablet Take 1 tablet (25 mg total) by mouth every 6 (six) hours as needed for nausea or vomiting. 30 tablet 0   QUEtiapine (SEROQUEL) 100 MG tablet Take 100 mg by mouth at bedtime.     zolpidem (AMBIEN) 10 MG tablet Take 10 mg by mouth at bedtime as needed for sleep.     No current facility-administered medications for this visit.   Allergies:  Patient has no known allergies.   Social History: The patient  reports that she has never smoked. She does not have any smokeless tobacco history on file. She reports that she does not drink alcohol and does not use drugs.   Family History: The patient's family history includes Anxiety disorder in her mother; Depression in  her mother; Schizophrenia in her father.   ROS:  Please see the history of present illness. Otherwise, complete review of systems is positive for none.  All other systems are reviewed and negative.   Physical Exam: VS:  BP 118/78   Pulse 78   Ht 5' 2.25" (1.581 m)   Wt 255 lb 3.2 oz (115.8 kg)   SpO2 96%   BMI 46.30 kg/m , BMI Body mass index is 46.3 kg/m.  Wt Readings from Last 3 Encounters:  12/27/22 255 lb 3.2 oz (115.8 kg)  06/23/22 270 lb 12.8 oz (122.8 kg)  04/26/22 278 lb 12.8 oz (126.5 kg)    General: Patient appears comfortable at rest. HEENT: Conjunctiva and lids normal, oropharynx clear with moist mucosa. Neck: Supple, no elevated JVP or carotid bruits, no thyromegaly. Lungs: Clear to auscultation, nonlabored breathing at rest. Cardiac: Regular rate and rhythm, no S3 or significant systolic murmur, no pericardial rub. Abdomen: Soft, nontender, no hepatomegaly, bowel sounds present, no guarding or rebound. Extremities: No pitting edema, distal pulses 2+. Skin: Warm and dry. Musculoskeletal: No kyphosis. Neuropsychiatric: Alert and oriented x3, affect grossly appropriate.  Recent Labwork: No results found for requested labs within last 365 days.  No results found for: "CHOL", "TRIG", "HDL", "CHOLHDL", "VLDL", "LDLCALC", "LDLDIRECT"  Other Studies Reviewed Today: I personally reviewed the heart care referral records from the PCP  Assessment and Plan: Patient is a 67 year old F known to have new onset paroxysmal atrial fibrillation (diagnosed on event monitor from 03/2022), 13.2% PAC burden, 2.2% PVC burden, HTN, HLD, OSA on CPAP is here for follow-up visit.  # Paroxysmal A-fib # 13.2% PAC burden # 2.2% PVC burden -Occasional palpitations.  EKG today showed NSR, rate controlled -Continue metoprolol tartrate 25 mg twice daily -Continue Eliquis 5 mg twice daily (no falls and no bleeding) -Continue CPAP for OSA management  # OSA on CPAP -Compliant with CPAP.  The  inside rubber lining of the CPAP is coming off and she preferred to be seen by sleep medicine for OSA management.  # HTN, controlled -Continue losartan 25 mg once daily.  # HLD -Continue lovastatin 40 mg nightly, goal LDL less than 100.   Medication Adjustments/Labs and Tests Ordered: Current medicines are reviewed at length with the patient today.  Concerns regarding medicines are outlined above.   Tests Ordered: Orders Placed This Encounter  Procedures   EKG 12-Lead    Medication Changes: No orders of the defined types were placed in this encounter.   Disposition:  Follow up  6 months  Signed Breanna Barnhard Verne Spurr, Breanna [name], 12/27/2022 11:41 AM    [facility] [address] [place], [place], [place] [address]

## 2023-01-02 ENCOUNTER — Ambulatory Visit (INDEPENDENT_AMBULATORY_CARE_PROVIDER_SITE_OTHER): Payer: Medicare HMO | Admitting: Psychiatry

## 2023-01-02 DIAGNOSIS — F321 Major depressive disorder, single episode, moderate: Secondary | ICD-10-CM

## 2023-01-02 NOTE — Progress Notes (Signed)
Virtual Visit via Telephone Note  I connected with Breanna [name] on 01/02/23 at 4:10 PM EST  by telephone and verified that I am speaking with the correct person using two identifiers.  Location: Patient: Home Provider: [facility] [place] office    I discussed the limitations, risks, security and privacy concerns of performing an evaluation and management service by telephone and the availability of in person appointments. I also discussed with the patient that there may be a patient responsible charge related to this service. The patient expressed understanding and agreed to proceed.    I provided 19 minutes of non-face-to-face time during this encounter.   Breanna Salvage, LCSW   IN-PERSON  THERAPIST PROGRESS NOTE  Session Time: Monday  01/02/2023  4:10 PM - 4:29 PM   Participation Level: Active  Behavioral Response: CasualAlert/euthymic   Type of Therapy: Individual Therapy  Treatment Goals addressed:  Reduce frequency, intensity, and duration of depression symptoms so that daily functioning is improved AEB by reducing symptom frequency from 3 x per week to 1 x per week per self-report     Breanna [name] will practice behavioral activation skills 3-4  times per week for the next 12 weeks weeks   ProgressTowards Goals: Progressing  Interventions: CBT and Supportive  Summary: Breanna [name] is a 67 y.o. female who is referred for services by PA from Dr. Emerson Monte office due to pt experiencing grief and loss issues. Per pt's report, she used to see Dr. Omelia Blackwater who previously dx'd pt with bipolar disorder and schizophrenia. PCP currently providing medication management for pt. She reports no involvement with any other behavioral health providers. She denies any psychiatric hospitalizations.  Patient reports being referred by PCP based on recommendations from her nutritionist.  Patient reports recently having a conversation with nutritionist about her grandmother who died many many years ago.   Patient also reports worries about her son who is incarcerated.  He is sick and she cannot help him per her report.  She also reports just worrying about all her children who are all adults.  Patient's current symptoms include sadness, difficulty concentrating, fatigue, hopelessness, irritability, tearfulness, worthlessness, sleep difficulty.   Patient last was seen about 2 weeks  ago.  She reports doing well since last session. Per her report, she continues to take vraylar. She states experiencing increased energy, improved motivation, and improved mood since taking the medication. She also reports going out more including attending church regularly and singing in the choir. She also continues to visit her daughter. She is excited today as she is in the process of moving from a one bedroom apt to a 2 bedroom apt. She plans to use the extra room to create a sewing room. She reports experiencing some sadness triggered by the holidays due to missing her deceased grandmother. However, she is not overwhelmed by this. She is planning to be with her daughters for Christmas. Pt also is making plans to visit her son who is incarcerated in the new year. Pt is pleased with her progress.   Suicidal/Homicidal: Negativewithout intent/plan       Therapist Response: Reviewed symptoms, praised and reinforced patient's medication compliance, discussed effects, praised and reinforced patient's increased involvement in activity, discussed integrated grief and normalized feelings related to grief and loss triggered by the holidays, assisted patient identify healthy ways to cope with grief and loss during the holidays including participating in a memorialization such as lighting a candle, , reviewed treatment plan, discussed patient's progress in  treatment and next steps for treatment to include relapse prevention strategies, sent patient signature page and copy of treatment plan via MyChart .  Plan: Return again in 2  weeks.  Diagnosis: Major depressive disorder, single episode, moderate (HCC)  Collaboration of Care: Primary Care Provider AEB patient works with PCP Dr. Sudie Bailey for medication management  Patient/Guardian was advised Release of Information must be obtained prior to any record release in order to collaborate their care with an outside provider. Patient/Guardian was advised if they have not already done so to contact the registration department to sign all necessary forms in order for [place] to release information regarding their care.   Consent: Patient/Guardian gives verbal consent for treatment and assignment of benefits for services provided during this visit. Patient/Guardian expressed understanding and agreed to proceed.   Breanna Salvage, LCSW 01/02/2023

## 2023-01-16 ENCOUNTER — Ambulatory Visit (HOSPITAL_COMMUNITY): Payer: Medicare HMO | Admitting: Psychiatry

## 2023-01-16 DIAGNOSIS — F321 Major depressive disorder, single episode, moderate: Secondary | ICD-10-CM | POA: Diagnosis not present

## 2023-01-16 NOTE — Progress Notes (Signed)
 Virtual Visit via Video Note  I connected with Breanna [name] on 01/16/23 at  4:00 PM EST by a video enabled telemedicine application and verified that I am speaking with the correct person using two identifiers.  Location: Patient: Home Provider: [facility] [place] office    I discussed the limitations of evaluation and management by telemedicine and the availability of in person appointments. The patient expressed understanding and agreed to proceed.   I provided 20 minutes of non-face-to-face time during this encounter.   Winton FORBES Rubinstein, LCSW   IN-PERSON  THERAPIST PROGRESS NOTE  Session Time: Monday  01/16/2023  4:00 PM - 4:20 PM    Participation Level: Active  Behavioral Response: CasualAlert/euthymic   Type of Therapy: Individual Therapy  Treatment Goals addressed:  Reduce frequency, intensity, and duration of depression symptoms so that daily functioning is improved AEB by reducing symptom frequency from 3 x per week to 1 x per week per self-report     Breanna [name] will practice behavioral activation skills 3-4  times per week for the next 12 [name] [name]   ProgressTowards Goals: Progressing  Interventions: CBT and Supportive  Summary: Breanna [name] is a 68 y.o. female who is referred for services by PA from Dr. Katheryn office due to pt experiencing grief and loss issues. Per pt's report, she used to see Dr. Linnie who previously dx'd pt with bipolar disorder and schizophrenia. PCP currently providing medication management for pt. She reports no involvement with any other behavioral health providers. She denies any psychiatric hospitalizations.  Patient reports being referred by PCP based on recommendations from her nutritionist.  Patient reports recently having a conversation with nutritionist about her grandmother who died many many years ago.  Patient also reports worries about her son who is incarcerated.  He is sick and she cannot help him per her report.  She also reports  just worrying about all her children who are all adults.  Patient's current symptoms include sadness, difficulty concentrating, fatigue, hopelessness, irritability, tearfulness, worthlessness, sleep difficulty.   Patient last was seen about 2 [name]  ago.  She reports continuing to do well since last session. Per her report, she continues to take vraylar and experiences increased energy, improved motivation, and improved mood. However, she states she has started to feel a little bored. She is in the process of moving but expresses frustration people are not as readily available to help her due to their obligations. She also expresses frustration with self she chose this time of the year to move and verbalizes should and ought statements. She still has  been attending church regularly and singing in the church choir. She reports enjoying this very much.   Suicidal/Homicidal: Negativewithout intent/plan       Therapist Response: Reviewed symptoms, praised and reinforced patient's medication compliance,  praised and reinforced patient's continued involvement in activity, assisted pt identify and replace should/ought statements with wish and prefer to reduce negative thoughts about self, began to provide psychoeducation on lapse versus relapse of depression Plan: Return again in 2 [name].  Diagnosis: Major depressive disorder, single episode, moderate (HCC)  Collaboration of Care: Primary Care Provider AEB patient works with PCP Dr. Knowlton for medication management  Patient/Guardian was advised Release of Information must be obtained prior to any record release in order to collaborate their care with an outside provider. Patient/Guardian was advised if they have not already done so to contact the registration department to sign all necessary forms in order for us   to release information regarding their care.   Consent: Patient/Guardian gives verbal consent for treatment and assignment of benefits for  services provided during this visit. Patient/Guardian expressed understanding and agreed to proceed.   Winton FORBES Rubinstein, LCSW 01/16/2023

## 2023-01-30 ENCOUNTER — Ambulatory Visit (HOSPITAL_COMMUNITY): Payer: Medicare HMO | Admitting: Psychiatry

## 2023-01-30 ENCOUNTER — Telehealth (HOSPITAL_COMMUNITY): Payer: Self-pay | Admitting: Psychiatry

## 2023-01-30 NOTE — Telephone Encounter (Signed)
Therapist attempted to contact pt via phone regarding scheduled in office appointment and received voice mail recording. Therapist left message indicating attempt and requesting pt call office.

## 2023-02-13 ENCOUNTER — Ambulatory Visit (HOSPITAL_COMMUNITY): Payer: Medicare HMO | Admitting: Psychiatry

## 2023-02-20 ENCOUNTER — Encounter (HOSPITAL_COMMUNITY): Payer: Self-pay | Admitting: Psychiatry

## 2023-02-22 ENCOUNTER — Encounter (HOSPITAL_BASED_OUTPATIENT_CLINIC_OR_DEPARTMENT_OTHER): Payer: Self-pay | Admitting: Pulmonary Disease

## 2023-02-22 ENCOUNTER — Ambulatory Visit (HOSPITAL_BASED_OUTPATIENT_CLINIC_OR_DEPARTMENT_OTHER): Payer: Medicare HMO | Admitting: Pulmonary Disease

## 2023-02-22 VITALS — BP 128/82 | HR 67 | Ht 62.5 in | Wt 244.0 lb

## 2023-02-22 DIAGNOSIS — G4733 Obstructive sleep apnea (adult) (pediatric): Secondary | ICD-10-CM | POA: Diagnosis not present

## 2023-02-22 NOTE — Patient Instructions (Addendum)
X rx for replacement autoCPAP 8-12 cm  X Home sleep test   VISIT SUMMARY:  During today's visit, we discussed your ongoing issues with sleep apnea and the malfunctioning CPAP machine you have been using for over ten years. We also reviewed your atrial fibrillation and its connection to your sleep apnea.   YOUR PLAN:  -OBSTRUCTIVE SLEEP APNEA: Obstructive sleep apnea is a condition where your airway becomes blocked during sleep, causing breathing interruptions. Your current CPAP machine is malfunctioning, which may be affecting your sleep quality and overall health. We will check for your old sleep study records from [facility]. If found, we will prescribe a new CPAP machine. If not, we will order a home sleep study. The new CPAP machine will be a smart device that adjusts pressure automatically. Please follow up in 1-2 months to review the CPAP data and adjust settings if necessary.  -ATRIAL FIBRILLATION: Atrial fibrillation is an irregular and often rapid heart rate that can increase your risk of strokes, heart failure, and other heart-related complications. It is influenced by your obstructive sleep apnea. Ensuring optimal management of your sleep apnea is crucial to prevent the recurrence of atrial fibrillation.   INSTRUCTIONS:  Please follow up in 3 months to review the CPAP data and adjust settings if necessary. Contact us if there are delays in receiving the new CPAP machine or if there are issues with the home sleep study.

## 2023-02-22 NOTE — Progress Notes (Signed)
Subjective:    Patient ID: Breanna [name], female    DOB: [birth year], 68 y.o.   MRN: [id number]  HPI  The patient, with a history of PAF and sleep apnea, presents for evaluation of her sleep apnea. She reports experiencing sleep paralysis and difficulty breathing at night, which she describes as feeling like she is "out of body" and unable to wake up. These symptoms have improved with the use of a CPAP machine, which she has been using for several years. However, the machine is over ten years old and is not working properly, leading to concerns about the adequacy of her sleep apnea treatment. The patient also reports taking several medications to help with sleep, including Xanax, Seroquel, and Ambien, indicating a long-standing issue with insomnia   We reviewed sleep studies from 2013. Sleepiness score is 0.  She reports episodes of sleep paralysis especially with sleep onset.  Bedtime is late between midnight and 2 AM, sleep latency is about an hour, she sleeps on her side on her stomach reports 2-3 nocturnal awakenings and is out of bed between 10 and 11 AM feeling rested without dryness of mouth or headache She has lost 15 pounds over the past 10 years  There is no history suggestive of cataplexy or parasomnias   PMH : paroxysmal A-fib on AC, HTN, HLD   Significant tests/ events reviewed  NPSG 09/2011 >> wt 307 lbs -AHI 27/hour, low sat 81% CPAP titration 06/2012 >> 9 cm adequate, weight 290  Past Medical History:  Diagnosis Date   GERD (gastroesophageal reflux disease)    Hypercholesterolemia    Hypertension    Neuropathy    Osteoarthritis    Pain    Sleep apnea     Past Surgical History:  Procedure Laterality Date   ABDOMINAL HYSTERECTOMY     TUBAL LIGATION      No Known Allergies  Social History   Socioeconomic History   Marital status: Divorced    Spouse name: Not on file   Number of children: Not on file   Years of education: Not on file   Highest education level:  Not on file  Occupational History   Not on file  Tobacco Use   Smoking status: Never   Smokeless tobacco: Not on file  Vaping Use   Vaping status: Never Used  Substance and Sexual Activity   Alcohol use: No   Drug use: No   Sexual activity: Not Currently  Other Topics Concern   Not on file  Social History Narrative   Not on file   Social Drivers of Health   Financial Resource Strain: Not on file  Food Insecurity: Not on file  Transportation Needs: Not on file  Physical Activity: Not on file  Stress: Not on file  Social Connections: Not on file  Intimate Partner Violence: Not on file    Family History  Problem Relation Age of Onset   Depression Mother    Anxiety disorder Mother    Schizophrenia Father        Review of Systems Constitutional: negative for anorexia, fevers and sweats  Eyes: negative for irritation, redness and visual disturbance  Ears, nose, mouth, throat, and face: negative for earaches, epistaxis, nasal congestion and sore throat  Respiratory: negative for cough, dyspnea on exertion, sputum and wheezing  Cardiovascular: negative for chest pain, dyspnea, lower extremity edema, orthopnea, palpitations and syncope  Gastrointestinal: negative for abdominal pain, constipation, diarrhea, melena, nausea and vomiting  Genitourinary:negative for dysuria,  frequency and hematuria  Hematologic/lymphatic: negative for bleeding, easy bruising and lymphadenopathy  Musculoskeletal:negative for arthralgias, muscle weakness and stiff joints  Neurological: negative for coordination problems, gait problems, headaches and weakness  Endocrine: negative for diabetic symptoms including polydipsia, polyuria and weight loss     Objective:   Physical Exam   Gen. Pleasant, obese, in no distress, normal affect ENT - no pallor,icterus, no post nasal drip, class 2-3 airway Neck: No JVD, no thyromegaly, no carotid bruits Lungs: no use of accessory muscles, no dullness to  percussion, decreased without rales or rhonchi  Cardiovascular: Rhythm regular, heart sounds  normal, no murmurs or gallops, no peripheral edema Abdomen: soft and non-tender, no hepatosplenomegaly, BS normal. Musculoskeletal: No deformities, no cyanosis or clubbing Neuro:  alert, non focal, no tremors      Assessment & Plan:   Obstructive Sleep Apnea Chronic obstructive sleep apnea with symptoms of sleep paralysis and nocturnal dyspnea. She has been using a CPAP machine for over ten years, which is now malfunctioning. The CPAP has significantly alleviated her symptoms. Uncontrolled sleep apnea can exacerbate her atrial fibrillation (AFib). Discussed the importance of managing sleep apnea to reduce cardiac stress and improve overall health. - Check for old sleep study records from [facility] - If old study is found, send a prescription for a new CPAP machine - If old study is not found, order a home sleep study - Provide an autoCPAP machine that adjusts pressure automatically 8-12 cm - Follow up in 1-2 months to review CPAP data and adjust settings if necessary  Atrial Fibrillation Intermittent atrial fibrillation, currently managed with anticoagulants and rate control medication. AFib is influenced by her obstructive sleep apnea, and controlling sleep apnea is crucial to prevent AFib recurrence. - Ensure optimal management of obstructive sleep apnea to prevent AFib recurrence  General Health Maintenance Hypertension, asthma, diabetes, and hypercholesterolemia. Advised to avoid sodas and tea to prevent fluid retention. - Advise to avoid sodas and tea to prevent fluid retention  Follow-up - Follow up in 1-2 months to review CPAP data and adjust settings if necessary - Contact if there are delays in receiving the new CPAP machine or if there are issues with the home sleep study.

## 2023-02-23 ENCOUNTER — Other Ambulatory Visit: Payer: Self-pay | Admitting: Internal Medicine

## 2023-02-23 DIAGNOSIS — I480 Paroxysmal atrial fibrillation: Secondary | ICD-10-CM

## 2023-02-23 NOTE — Telephone Encounter (Signed)
Prescription refill request for Eliquis received. Indication: PAF Last office visit: 12/27/22  V Mallipeddi MD Scr: 0.62 on 11/08/22  [facility] Age: 68 Weight: 115.8kg  Based on above findings Eliquis 5mg  twice daily is the appropriate dose.  Refill approved.

## 2023-03-11 ENCOUNTER — Encounter

## 2023-03-11 DIAGNOSIS — G4733 Obstructive sleep apnea (adult) (pediatric): Secondary | ICD-10-CM

## 2023-03-15 ENCOUNTER — Other Ambulatory Visit: Payer: Self-pay

## 2023-03-20 ENCOUNTER — Telehealth: Payer: Self-pay | Admitting: Pulmonary Disease

## 2023-03-20 DIAGNOSIS — G4733 Obstructive sleep apnea (adult) (pediatric): Secondary | ICD-10-CM | POA: Diagnosis not present

## 2023-03-20 NOTE — Telephone Encounter (Signed)
 HST showed mild  OSA with AHI 11/ hr & low sat of 78%   Please ensure replacement autoCPAP 8-12 cm has been received by DME OV with APP in 6 wks

## 2023-03-21 ENCOUNTER — Ambulatory Visit (HOSPITAL_COMMUNITY): Payer: Medicare HMO | Admitting: Psychiatry

## 2023-03-21 ENCOUNTER — Other Ambulatory Visit: Payer: Self-pay

## 2023-03-21 DIAGNOSIS — G4733 Obstructive sleep apnea (adult) (pediatric): Secondary | ICD-10-CM

## 2023-03-21 NOTE — Telephone Encounter (Signed)
 Hello, I'm sorry.  I just put an orders only in today for a replacement CPAP.  Please let me know if this looks okay.  The OV note  from 02/22/2023 stated: We will check for your old sleep study records from [facility]. If found, we will prescribe a new CPAP machine. If not, we will order a home sleep study.   I thought I was waiting to see if records were coming in from [facility] first.    Again, I'm sorry.  Let me know if I can do anything else!

## 2023-03-23 ENCOUNTER — Telehealth: Payer: Self-pay | Admitting: Pulmonary Disease

## 2023-03-23 NOTE — Telephone Encounter (Signed)
 Please call PT w/HST results. Appt made for 5/5 for FU. [phone]

## 2023-03-23 NOTE — Telephone Encounter (Signed)
 Pt.notified

## 2023-04-05 NOTE — Telephone Encounter (Signed)
 NFN

## 2023-05-04 ENCOUNTER — Telehealth (HOSPITAL_BASED_OUTPATIENT_CLINIC_OR_DEPARTMENT_OTHER): Payer: Self-pay

## 2023-05-04 DIAGNOSIS — G4733 Obstructive sleep apnea (adult) (pediatric): Secondary | ICD-10-CM

## 2023-05-04 NOTE — Telephone Encounter (Signed)
 Pt states pressure is too high and after trying two masks it still leaking around her mask.

## 2023-05-04 NOTE — Telephone Encounter (Signed)
 Copied from CRM [id number]. Topic: Clinical - Prescription Issue >> May 03, 2023  5:06 PM [name] B wrote: Reason for CRM: Patient would discuss adjusting pressure on CPAP adjusted. Called [facility] and they stated they need a script.

## 2023-05-04 NOTE — Telephone Encounter (Signed)
**Note De-identified  [place] Obfuscation** Please advise 

## 2023-05-05 ENCOUNTER — Telehealth (HOSPITAL_BASED_OUTPATIENT_CLINIC_OR_DEPARTMENT_OTHER): Payer: Self-pay

## 2023-05-05 NOTE — Addendum Note (Signed)
 Addended by: Rosalie Buenaventura L on: 05/05/2023 01:34 PM   Modules accepted: Orders

## 2023-05-05 NOTE — Telephone Encounter (Signed)
Pt notified orders have been placed.

## 2023-05-05 NOTE — Telephone Encounter (Signed)
   Pt notified on different encounter that orders have been placed Copied from CRM #[id number]. Topic: Clinical - Prescription Issue >> May 05, 2023 12:58 PM Roseanne Cones wrote: Please call patient - she wanted to know if the settings and prescription have been modified on her CPAP machine - Please have one of the nurses reach out to her to explain what the change (lowering the pressure) and if they have a status on whether this has been conveyed to [facility] via a prescription order. Based on the documentation, I was only able to rely that Dr. Villa Greaser and the nurse, Bernis Brisker are working on resolving the issue for the patient. Please call her when you have a moment.

## 2023-05-15 ENCOUNTER — Ambulatory Visit (HOSPITAL_BASED_OUTPATIENT_CLINIC_OR_DEPARTMENT_OTHER): Payer: Medicare HMO | Admitting: Pulmonary Disease

## 2023-07-27 ENCOUNTER — Ambulatory Visit (HOSPITAL_BASED_OUTPATIENT_CLINIC_OR_DEPARTMENT_OTHER): Admitting: Pulmonary Disease

## 2023-07-27 ENCOUNTER — Encounter (HOSPITAL_BASED_OUTPATIENT_CLINIC_OR_DEPARTMENT_OTHER): Payer: Self-pay | Admitting: Pulmonary Disease

## 2023-07-27 VITALS — BP 114/76 | HR 71 | Ht <= 58 in | Wt 244.0 lb

## 2023-07-27 DIAGNOSIS — G4733 Obstructive sleep apnea (adult) (pediatric): Secondary | ICD-10-CM

## 2023-07-27 NOTE — Patient Instructions (Addendum)
 Trial of airfit F30 fullface mask  Try to use CPAP at least 6h every night

## 2023-07-27 NOTE — Progress Notes (Signed)
   Subjective:    Patient ID: Breanna [name], female    DOB: Sep 23, 1955, 68 y.o.   MRN: [id number]  68 yo for FU of obstructive sleep apnea  -with symptoms of sleep paralysis and nocturnal dyspnea    Discussed the use of AI scribe software for clinical note transcription with the patient, who gave verbal consent to proceed.  History of Present Illness Breanna [name] is a 68 year old female with obstructive sleep apnea who presents for a follow-up visit.  She has been managing obstructive sleep apnea since February, confirmed by a home sleep test showing mild sleep apnea with severe desaturation. Initially prescribed ROCPAP with a pressure setting of eight to twelve centimeters, she experienced high pressure and mask leakage. The pressure was adjusted to five to twelve centimeters, improving comfort and reducing leakage.  She currently uses a nasal mask, preferring it despite occasional mouth dryness from mouth opening during sleep. A chin strap was attempted but found cumbersome and possibly ineffective. She consistently uses the CPAP machine for four to four and a half hours nightly, sometimes reaching six hours.  In June, significant mask leakage occurred, but adjustments have decreased leakage. Usage duration varies, maintaining an average of four to four and a half hours per night.     PMH : paroxysmal A-fib on AC, HTN, HLD    Significant tests/ events reviewed  03/2023 HST showed mild  OSA with AHI 11/ hr & low sat of 78%    NPSG 09/2011 >> wt 307 lbs -AHI 27/hour, low sat 81% CPAP titration 06/2012 >> 9 cm adequate, weight 290  Review of Systems  neg for any significant sore throat, dysphagia, itching, sneezing, nasal congestion or excess/ purulent secretions, fever, chills, sweats, unintended wt loss, pleuritic or exertional cp, hempoptysis, orthopnea pnd or change in chronic leg swelling. Also denies presyncope, palpitations, heartburn, abdominal pain, nausea, vomiting, diarrhea or  change in bowel or urinary habits, dysuria,hematuria, rash, arthralgias, visual complaints, headache, numbness weakness or ataxia.      Objective:   Physical Exam  Gen. Pleasant, obese, in no distress ENT - no lesions, no post nasal drip Neck: No JVD, no thyromegaly, no carotid bruits Lungs: no use of accessory muscles, no dullness to percussion, decreased without rales or rhonchi  Cardiovascular: Rhythm regular, heart sounds  normal, no murmurs or gallops, no peripheral edema Musculoskeletal: No deformities, no cyanosis or clubbing , no tremors        Assessment & Plan:   Assessment and Plan Assessment & Plan Obstructive Sleep Apnea (OSA) Mild obstructive sleep apnea with severe desaturation confirmed by home sleep test. Initial CPAP settings were too high, causing discomfort and mask leakage. Adjusted CPAP settings to 5-12 cm H2O, improving comfort and reducing mask leakage. Current usage is 4-4.5 hours per night, with variability. CPAP therapy reduced apnea events from 11-12 per hour to 0.3 per hour. Reports mouth dryness due to mouth opening during sleep, despite using a nasal mask. Chin strap provided but not effectively used. Considering alternative mask options to address leakage and dryness. - Continue CPAP settings at 5-12 cm H2O. - Provide a full face mask to address leakage and dryness. - Encourage CPAP use for at least 4-4.5 hours per night. - Educate on proper chin strap use to prevent mouth opening.

## 2023-09-18 ENCOUNTER — Other Ambulatory Visit (HOSPITAL_COMMUNITY): Payer: Self-pay | Admitting: Nurse Practitioner

## 2023-09-18 DIAGNOSIS — Z1231 Encounter for screening mammogram for malignant neoplasm of breast: Secondary | ICD-10-CM

## 2023-10-23 ENCOUNTER — Ambulatory Visit (HOSPITAL_COMMUNITY)
Admission: RE | Admit: 2023-10-23 | Discharge: 2023-10-23 | Disposition: A | Discharge status: Home / Self Care | Source: Ambulatory Visit | Attending: Nurse Practitioner | Admitting: Nurse Practitioner

## 2023-10-23 DIAGNOSIS — Z1231 Encounter for screening mammogram for malignant neoplasm of breast: Secondary | ICD-10-CM | POA: Diagnosis present

## 2023-12-25 ENCOUNTER — Ambulatory Visit: Admitting: Internal Medicine

## 2023-12-27 ENCOUNTER — Ambulatory Visit: Admitting: Internal Medicine

## 2023-12-27 ENCOUNTER — Encounter: Payer: Self-pay | Admitting: Internal Medicine

## 2023-12-27 VITALS — BP 128/78 | HR 64 | Ht 62.0 in | Wt 229.2 lb

## 2023-12-27 DIAGNOSIS — I480 Paroxysmal atrial fibrillation: Secondary | ICD-10-CM | POA: Diagnosis not present

## 2023-12-27 DIAGNOSIS — I493 Ventricular premature depolarization: Secondary | ICD-10-CM

## 2023-12-27 DIAGNOSIS — Z01810 Encounter for preprocedural cardiovascular examination: Secondary | ICD-10-CM | POA: Diagnosis not present

## 2023-12-27 DIAGNOSIS — I491 Atrial premature depolarization: Secondary | ICD-10-CM | POA: Diagnosis not present

## 2023-12-27 DIAGNOSIS — I10 Essential (primary) hypertension: Secondary | ICD-10-CM

## 2023-12-27 DIAGNOSIS — R002 Palpitations: Secondary | ICD-10-CM | POA: Diagnosis not present

## 2023-12-27 NOTE — Patient Instructions (Addendum)
 Medication Instructions:  Your physician recommends that you continue on your current medications as directed. Please refer to the Current Medication list given to you today.   Labwork: CBC and BMET to be completed the week of Cardioversion   Testing/Procedures: Your physician has recommended that you have a Cardioversion (DCCV). Electrical Cardioversion uses a jolt of electricity to your heart either through paddles or wired patches attached to your chest. This is a controlled, usually prescheduled, procedure. Defibrillation is done under light anesthesia in the hospital, and you usually go home the day of the procedure. This is done to get your heart back into a normal rhythm. You are not awake for the procedure. Please see the instruction sheet given to you today.   Follow-Up: Your physician recommends that you schedule a follow-up appointment in: 1 month after DCCV  Any Other Special Instructions Will Be Listed Below (If Applicable). Thank you for choosing [facility]!     If you need a refill on your cardiac medications before your next appointment, please call your pharmacy.

## 2023-12-27 NOTE — Progress Notes (Addendum)
 Cardiology Office Note  Date: 12/27/2023   ID: Breanna, [name] Breanna [name], MRN [id number]  PCP:  Myrna Camelia CHRISTELLA, NP  Cardiologist:  Diannah SHAUNNA Maywood, MD Electrophysiologist:  None     History of Present Illness: Breanna [name] is a 68 y.o. female known to have paroxysmal A-fib on AC, HTN, HLD, OSA on CPAP presented to cardiology clinic for follow-up visit.  Patient was initially referred to cardiology clinic in 02/2022 for evaluation of palpitations.  Event monitor in 03/2022 showed 6% atrial fibrillation burden with the longest duration for 1 hour 48 minutes, HR range between 96 and 217 bpm, average HR 137 bpm. PAC burden is 13.2%, PVC burden is 2.2%. Patient symptomatic with A-fib, PAC and PVC.  She was started on rate controlling agents and systemic anticoagulation. She is here for follow-up visit.    EKG today showed atrial fibrillation, HR 64 bpm.  Does not have any symptoms of palpitations, SOB, fatigue.  No angina, DOE.  Was diagnosed with OSA, on CPAP.  Past Medical History:  Diagnosis Date   GERD (gastroesophageal reflux disease)    Hypercholesterolemia    Hypertension    Neuropathy    Osteoarthritis    Pain    Sleep apnea     Past Surgical History:  Procedure Laterality Date   ABDOMINAL HYSTERECTOMY     TUBAL LIGATION      Current Outpatient Medications  Medication Sig Dispense Refill   albuterol (VENTOLIN HFA) 108 (90 Base) MCG/ACT inhaler Inhale 2-3 puffs into the lungs 3 (three) times daily.     ALPRAZolam (XANAX) 1 MG tablet Take 1 mg by mouth at bedtime as needed for anxiety.     cetirizine (ZYRTEC) 10 MG tablet Take 10 mg by mouth daily.     diclofenac Sodium (VOLTAREN) 1 % GEL Apply 2 g topically in the morning, at noon, and at bedtime.     docusate sodium (COLACE) 100 MG capsule Take 100 mg by mouth 2 (two) times daily.     ELIQUIS  5 MG TABS tablet TAKE 1 TABLET(5 MG) BY MOUTH TWICE DAILY 180 tablet 1   fluticasone (FLONASE) 50 MCG/ACT nasal spray  Place 2 sprays into both nostrils daily.     furosemide (LASIX) 40 MG tablet Take 40 mg by mouth as needed for fluid or edema.     gabapentin (NEURONTIN) 300 MG capsule Take 300 mg by mouth 3 (three) times daily.     hydrochlorothiazide (HYDRODIURIL) 25 MG tablet Take 25 mg by mouth as needed (for fluid).     HYDROcodone-acetaminophen (NORCO) 10-325 MG tablet Take 1 tablet by mouth every 6 (six) hours as needed.     levothyroxine (SYNTHROID) 50 MCG tablet Take 50 mcg by mouth every morning.     LINZESS 145 MCG CAPS capsule Take 145 mcg by mouth as needed (constipation).     losartan (COZAAR) 25 MG tablet Take 25 mg by mouth daily.     lovastatin (ALTOPREV) 40 MG 24 hr tablet Take 40 mg by mouth at bedtime.     metoprolol  tartrate (LOPRESSOR ) 25 MG tablet Take 1 tablet (25 mg total) by mouth 2 (two) times daily. 180 tablet 3   montelukast (SINGULAIR) 10 MG tablet Take 10 mg by mouth daily.     MOUNJARO 15 MG/0.5ML Pen Inject 15 mg into the skin once a week.     pantoprazole (PROTONIX) 40 MG tablet Take 40 mg by mouth daily.     potassium chloride (  MICRO-K) 10 MEQ CR capsule Take 20 mEq by mouth daily.     promethazine  (PHENERGAN ) 25 MG tablet Take 1 tablet (25 mg total) by mouth every 6 (six) hours as needed for nausea or vomiting. 30 tablet 0   QUEtiapine (SEROQUEL) 100 MG tablet Take 100 mg by mouth at bedtime.     zolpidem (AMBIEN) 10 MG tablet Take 10 mg by mouth at bedtime as needed for sleep.     No current facility-administered medications for this visit.   Allergies:  Patient has no known allergies.   Social History: The patient  reports that she has never smoked. She does not have any smokeless tobacco history on file. She reports that she does not drink alcohol and does not use drugs.   Family History: The patient's family history includes Anxiety disorder in her mother; Depression in her mother; Schizophrenia in her father.   ROS:  Please see the history of present illness.  Otherwise, complete review of systems is positive for none.  All other systems are reviewed and negative.   Physical Exam: VS:  BP 128/78   Pulse 64   Ht 5' 2 (1.575 m)   Wt 229 lb 3.2 oz (104 kg)   SpO2 99%   BMI 41.92 kg/m , BMI Body mass index is 41.92 kg/m.  Wt Readings from Last 3 Encounters:  12/27/23 229 lb 3.2 oz (104 kg)  07/27/23 244 lb (110.7 kg)  02/22/23 244 lb (110.7 kg)    General: Patient appears comfortable at rest. HEENT: Conjunctiva and lids normal, oropharynx clear with moist mucosa. Neck: Supple, no elevated JVP or carotid bruits, no thyromegaly. Lungs: Clear to auscultation, nonlabored breathing at rest. Cardiac: Regular rate and rhythm, no S3 or significant systolic murmur, no pericardial rub. Abdomen: Soft, nontender, no hepatomegaly, bowel sounds present, no guarding or rebound. Extremities: No pitting edema, distal pulses 2+. Skin: Warm and dry. Musculoskeletal: No kyphosis. Neuropsychiatric: Alert and oriented x3, affect grossly appropriate.  Recent Labwork: No results found for requested labs within last 365 days.  No results found for: CHOL, TRIG, HDL, CHOLHDL, VLDL, LDLCALC, LDLDIRECT  Other Studies Reviewed Today: I personally reviewed the heart care referral records from the PCP  Assessment and Plan:  # Paroxysmal A-fib # 13.2% PAC burden # 2.2% PVC burden - EKG today showed A-fib, HR 64 bpm.  Asymptomatic. - Continue metoprolol  tartrate 25 mg twice daily. Hold metoprolol  on the day before and on the morning of the procedure due to HR 64 bpm. - Continue Eliquis  5 mg twice daily.  - Continue CPAP for OSA management. - Patient will benefit from DCCV.  Did not miss any doses of anticoagulation in the last 3 weeks.  Informed consent for DCCV Risks, benefits and alternatives of direct current cardioversion reviewed including potential for post-cardioversion rhythms, especially life-threatening arrhythmias (ventricular  tachycardia and fibrillation, profound bradycardia). Major complications may include serious or fatal arrhythmias, myocardial damage, and acute pulmonary edema; minor complications include skin burns and transient hypotension. Benefits include restoration of sinus rhythm. Alternatives to treatment were discussed, questions were answered. Patient is willing to proceed.    # OSA on CPAP - Continue CPAP.  # HTN, controlled - Continue losartan 25 mg once daily.  # HLD - Continue lovastatin 40 mg nightly.  Goal LDL is 100.  30-minute spent in reviewing prior medical records, more than the labs, discussion and documentation  Medication Adjustments/Labs and Tests Ordered: Current medicines are reviewed at length with the patient  today.  Concerns regarding medicines are outlined above.   Tests Ordered: Orders Placed This Encounter  Procedures   EKG 12-Lead    Medication Changes: No orders of the defined types were placed in this encounter.   Disposition:  Follow up 6 months  Signed Bjorn Hallas Priya Dat Derksen, MD, 12/27/2023 11:51 AM    [facility] [address] [place], [place], [place] [address]

## 2023-12-28 ENCOUNTER — Telehealth: Payer: Self-pay

## 2023-12-28 DIAGNOSIS — I4819 Other persistent atrial fibrillation: Secondary | ICD-10-CM | POA: Insufficient documentation

## 2023-12-28 NOTE — Telephone Encounter (Signed)
 Called patient to let her know when her Cardioversion was scheduled. Pre-op date is 12/30 which will be a phone call. Procedure date is 01/12/2023 @ 9:00.Patient verbalized understanding

## 2024-01-06 LAB — BASIC METABOLIC PANEL WITH GFR
BUN/Creatinine Ratio: 22 (ref 12–28)
BUN: 15 mg/dL (ref 8–27)
CO2: 24 mmol/L (ref 20–29)
Calcium: 8.9 mg/dL (ref 8.7–10.3)
Chloride: 105 mmol/L (ref 96–106)
Creatinine, Ser: 0.69 mg/dL (ref 0.57–1.00)
Glucose: 84 mg/dL (ref 70–99)
Potassium: 4.1 mmol/L (ref 3.5–5.2)
Sodium: 145 mmol/L — ABNORMAL HIGH (ref 134–144)
eGFR: 93 mL/min/1.73

## 2024-01-06 LAB — CBC
Hematocrit: 37.5 % (ref 34.0–46.6)
Hemoglobin: 11.4 g/dL (ref 11.1–15.9)
MCH: 26.8 pg (ref 26.6–33.0)
MCHC: 30.4 g/dL — ABNORMAL LOW (ref 31.5–35.7)
MCV: 88 fL (ref 79–97)
Platelets: 183 x10E3/uL (ref 150–450)
RBC: 4.25 x10E6/uL (ref 3.77–5.28)
RDW: 12.2 % (ref 11.7–15.4)
WBC: 3.5 x10E3/uL (ref 3.4–10.8)

## 2024-01-08 ENCOUNTER — Ambulatory Visit: Payer: Self-pay | Admitting: Internal Medicine

## 2024-01-09 ENCOUNTER — Encounter (HOSPITAL_COMMUNITY)
Admission: RE | Admit: 2024-01-09 | Discharge: 2024-01-09 | Disposition: A | Discharge status: Home / Self Care | Source: Ambulatory Visit | Attending: Internal Medicine | Admitting: Internal Medicine

## 2024-01-09 ENCOUNTER — Telehealth: Payer: Self-pay | Admitting: Internal Medicine

## 2024-01-09 NOTE — Telephone Encounter (Signed)
 Called patient to let her know procedure has been cancelled and reminded her of follow up. She stated that she will keep her follow up in February. No further issues. Routed to provider so she would be aware.

## 2024-01-09 NOTE — Telephone Encounter (Signed)
 Patient stated she wants to cancel her procedure schedule on 1/2.  Patient stated she has been using her CPAP machine and she is not having any palpitation issues and her BP has been fine.

## 2024-01-12 ENCOUNTER — Encounter (HOSPITAL_COMMUNITY): Admission: RE | Payer: Self-pay

## 2024-01-12 ENCOUNTER — Ambulatory Visit (HOSPITAL_COMMUNITY): Admission: RE | Admit: 2024-01-12 | Admitting: Internal Medicine

## 2024-01-12 SURGERY — CARDIOVERSION
Anesthesia: Monitor Anesthesia Care

## 2024-02-22 ENCOUNTER — Ambulatory Visit: Admitting: Internal Medicine
